# Patient Record
Sex: Female | Born: 1960 | Race: White | Hispanic: No | Marital: Married | Smoking: Never smoker
Health system: Southern US, Community
[De-identification: ages and names within clinical notes are randomized; demographics above are authoritative.]

## PROBLEM LIST (undated history)

## (undated) DIAGNOSIS — M5030 Other cervical disc degeneration, unspecified cervical region: Secondary | ICD-10-CM

## (undated) DIAGNOSIS — E039 Hypothyroidism, unspecified: Secondary | ICD-10-CM

## (undated) DIAGNOSIS — D1803 Hemangioma of intra-abdominal structures: Secondary | ICD-10-CM

## (undated) DIAGNOSIS — K5790 Diverticulosis of intestine, part unspecified, without perforation or abscess without bleeding: Secondary | ICD-10-CM

## (undated) DIAGNOSIS — G4710 Hypersomnia, unspecified: Secondary | ICD-10-CM

## (undated) DIAGNOSIS — M51369 Other intervertebral disc degeneration, lumbar region without mention of lumbar back pain or lower extremity pain: Secondary | ICD-10-CM

## (undated) DIAGNOSIS — M5136 Other intervertebral disc degeneration, lumbar region: Secondary | ICD-10-CM

## (undated) DIAGNOSIS — D259 Leiomyoma of uterus, unspecified: Secondary | ICD-10-CM

## (undated) DIAGNOSIS — D649 Anemia, unspecified: Secondary | ICD-10-CM

## (undated) HISTORY — DX: Hemangioma of intra-abdominal structures: D18.03

## (undated) HISTORY — DX: Anemia, unspecified: D64.9

## (undated) HISTORY — DX: Other cervical disc degeneration, unspecified cervical region: M50.30

## (undated) HISTORY — DX: Other intervertebral disc degeneration, lumbar region: M51.36

## (undated) HISTORY — PX: THYROIDECTOMY, PARTIAL: SHX18

## (undated) HISTORY — DX: Other intervertebral disc degeneration, lumbar region without mention of lumbar back pain or lower extremity pain: M51.369

## (undated) HISTORY — DX: Leiomyoma of uterus, unspecified: D25.9

## (undated) HISTORY — PX: BREAST BIOPSY: SHX20

## (undated) HISTORY — PX: TONSILLECTOMY AND ADENOIDECTOMY: SUR1326

## (undated) HISTORY — PX: OTHER SURGICAL HISTORY: SHX169

## (undated) HISTORY — DX: Diverticulosis of intestine, part unspecified, without perforation or abscess without bleeding: K57.90

## (undated) HISTORY — DX: Hypothyroidism, unspecified: E03.9

## (undated) HISTORY — DX: Hypersomnia, unspecified: G47.10

---

## 1998-08-21 ENCOUNTER — Ambulatory Visit (HOSPITAL_BASED_OUTPATIENT_CLINIC_OR_DEPARTMENT_OTHER): Admission: RE | Admit: 1998-08-21 | Discharge: 1998-08-21 | Payer: Self-pay

## 2000-12-20 ENCOUNTER — Encounter: Payer: Self-pay | Admitting: Family Medicine

## 2000-12-20 ENCOUNTER — Ambulatory Visit (HOSPITAL_COMMUNITY): Admission: RE | Admit: 2000-12-20 | Discharge: 2000-12-20 | Payer: Self-pay | Admitting: Family Medicine

## 2002-03-09 ENCOUNTER — Other Ambulatory Visit: Admission: RE | Admit: 2002-03-09 | Discharge: 2002-03-09 | Payer: Self-pay | Admitting: Obstetrics and Gynecology

## 2003-02-11 ENCOUNTER — Encounter (INDEPENDENT_AMBULATORY_CARE_PROVIDER_SITE_OTHER): Payer: Self-pay | Admitting: Specialist

## 2003-02-11 ENCOUNTER — Ambulatory Visit (HOSPITAL_COMMUNITY): Admission: RE | Admit: 2003-02-11 | Discharge: 2003-02-11 | Payer: Self-pay | Admitting: Obstetrics and Gynecology

## 2003-06-06 ENCOUNTER — Other Ambulatory Visit: Admission: RE | Admit: 2003-06-06 | Discharge: 2003-06-06 | Payer: Self-pay | Admitting: Obstetrics and Gynecology

## 2004-07-11 ENCOUNTER — Other Ambulatory Visit: Admission: RE | Admit: 2004-07-11 | Discharge: 2004-07-11 | Payer: Self-pay | Admitting: Obstetrics and Gynecology

## 2005-03-05 ENCOUNTER — Emergency Department (HOSPITAL_COMMUNITY): Admission: EM | Admit: 2005-03-05 | Discharge: 2005-03-05 | Payer: Self-pay | Admitting: Emergency Medicine

## 2005-07-16 ENCOUNTER — Other Ambulatory Visit: Admission: RE | Admit: 2005-07-16 | Discharge: 2005-07-16 | Payer: Self-pay | Admitting: Obstetrics and Gynecology

## 2006-08-22 ENCOUNTER — Other Ambulatory Visit: Admission: RE | Admit: 2006-08-22 | Discharge: 2006-08-22 | Payer: Self-pay | Admitting: Obstetrics & Gynecology

## 2007-08-15 ENCOUNTER — Encounter: Admission: RE | Admit: 2007-08-15 | Discharge: 2007-08-15 | Payer: Self-pay | Admitting: Obstetrics & Gynecology

## 2007-09-24 ENCOUNTER — Other Ambulatory Visit: Admission: RE | Admit: 2007-09-24 | Discharge: 2007-09-24 | Payer: Self-pay | Admitting: Obstetrics & Gynecology

## 2007-11-25 DIAGNOSIS — D1803 Hemangioma of intra-abdominal structures: Secondary | ICD-10-CM

## 2007-11-25 HISTORY — DX: Hemangioma of intra-abdominal structures: D18.03

## 2008-04-18 ENCOUNTER — Encounter: Admission: RE | Admit: 2008-04-18 | Discharge: 2008-04-18 | Payer: Self-pay | Admitting: Emergency Medicine

## 2008-04-26 ENCOUNTER — Encounter: Admission: RE | Admit: 2008-04-26 | Discharge: 2008-04-26 | Payer: Self-pay | Admitting: Emergency Medicine

## 2008-04-26 ENCOUNTER — Other Ambulatory Visit: Admission: RE | Admit: 2008-04-26 | Discharge: 2008-04-26 | Payer: Self-pay | Admitting: Interventional Radiology

## 2008-04-26 ENCOUNTER — Encounter (INDEPENDENT_AMBULATORY_CARE_PROVIDER_SITE_OTHER): Payer: Self-pay | Admitting: Interventional Radiology

## 2008-06-20 ENCOUNTER — Ambulatory Visit (HOSPITAL_COMMUNITY): Admission: RE | Admit: 2008-06-20 | Discharge: 2008-06-21 | Payer: Self-pay | Admitting: Surgery

## 2008-06-20 ENCOUNTER — Encounter (INDEPENDENT_AMBULATORY_CARE_PROVIDER_SITE_OTHER): Payer: Self-pay | Admitting: Surgery

## 2008-10-01 ENCOUNTER — Emergency Department: Payer: Self-pay | Admitting: Emergency Medicine

## 2010-01-17 ENCOUNTER — Encounter: Admission: RE | Admit: 2010-01-17 | Discharge: 2010-01-17 | Payer: Self-pay | Admitting: Internal Medicine

## 2010-10-16 ENCOUNTER — Other Ambulatory Visit: Payer: Self-pay | Admitting: Obstetrics & Gynecology

## 2010-10-16 DIAGNOSIS — Z1231 Encounter for screening mammogram for malignant neoplasm of breast: Secondary | ICD-10-CM

## 2010-10-25 ENCOUNTER — Ambulatory Visit
Admission: RE | Admit: 2010-10-25 | Discharge: 2010-10-25 | Disposition: A | Discharge status: Home / Self Care | Payer: BC Managed Care – PPO | Source: Ambulatory Visit | Attending: Obstetrics & Gynecology | Admitting: Obstetrics & Gynecology

## 2010-10-25 DIAGNOSIS — Z1231 Encounter for screening mammogram for malignant neoplasm of breast: Secondary | ICD-10-CM

## 2011-01-08 NOTE — Op Note (Signed)
[name], [name]                ACCOUNT NO.:  [id number]   MEDICAL RECORD NO.:  [id number]          PATIENT TYPE:  OIB   LOCATION:  0098                         FACILITY:  [facility]   PHYSICIAN:  Velora Heckler, MD      DATE OF BIRTH:  [birth year]   DATE OF PROCEDURE:  06/20/2008  DATE OF DISCHARGE:                               OPERATIVE REPORT   PREOPERATIVE DIAGNOSIS:  Left thyroid nodule with cytologic atypia.   POSTOPERATIVE DIAGNOSIS:  Left thyroid nodule with cytologic atypia.   PROCEDURE:  Left thyroid lobectomy.   SURGEON:  Velora Heckler, MD, FACS   ASSISTANT:  Emelia Loron, MD   ANESTHESIA:  General.   ESTIMATED BLOOD LOSS:  Minimal.   PREPARATION:  ChloraPrep.   ESTIMATED BLOOD LOSS:  Minimal.   COMPLICATIONS:  None.   INDICATIONS:  The patient is a 50 year old white female from [place],  [place].  She presents at the request of Dr. Leslee Home with a  newly diagnosed left thyroid nodule found on routine physical  examination in August 2009.  Ultrasound showed a 2 cm solid nodule in  the left thyroid lobe.  Fine-needle aspiration showed follicular  epithelial cells with nuclear grooves and nuclear overlap.  The patient  now comes to surgery for resection for definitive diagnosis.   BODY OF REPORT:  Procedure is done in OR #6 at the [facility].  The patient is brought to the operating room, placed in a  supine position on the operating room table.  Following administration  of general anesthesia, the patient is positioned and then prepped and  draped in the usual strict aseptic fashion.  After ascertaining that an  adequate level of anesthesia had been achieved, a Kocher incision is  made with a #15 blade.  Dissection was carried through subcutaneous  tissues and platysma.  Hemostasis is obtained with the electrocautery.  Skin flaps were elevated cephalad and caudad from the thyroid notch to  the sternal notch.  A Mahorner  self-retaining retractor was placed for  exposure.  Strap muscles were incised in the midline.  Dissection is  begun on the left side.  Strap muscles are reflected laterally.  The  left thyroid lobe is exposed.  Lobe is small, quite firm, consistent  with thyroiditis.  There is a dominant nodule located anteriorly and  centrally in the lobe.  Inferior venous tributaries are divided between  small and medium Ligaclips using the Harmonic scalpel.  Superior pole  was dissected out.  Superior pole vessels are divided between medium  Ligaclips with the Harmonic scalpel.  Parathyroid tissue was identified  and preserved.  There is a moderate-sized pyramidal lobe which is also  dissected out with electrocautery and included with the specimen.  Gland  is rolled anteriorly.  Branches of the inferior thyroid artery are  divided between small Ligaclips with the Harmonic scalpel.  Gland is  rolled further anteriorly and the recurrent laryngeal nerve is  identified and preserved.  Using sharp dissection, the ligament of Allyson Sabal  is transected.  The gland is then  mobilized onto the anterior trachea  using the electrocautery for hemostasis.  Isthmus is mobilized across  the midline.  The isthmus is transected at its junction with the right  thyroid lobe using the Harmonic scalpel.  Specimen is submitted to  pathology where Dr. Guerry Bruin reviewed the specimen.  He notes changes  consistent with chronic lymphocytic thyroiditis.  He saw no evidence of  malignancy.  Neck is irrigated with warm saline.  Good hemostasis was  achieved.  Surgicel is placed in the operative field.  Strap muscles are  reapproximated in the midline with interrupted #3-0 Vicryl sutures.  Platysma was closed with interrupted #3-0 Vicryl sutures.  Skin is  closed with a running #4-0 Monocryl subcuticular suture.  Wound is  washed and dried and Benzoin and Steri-Strips are applied.  Sterile  dressings are applied.  The patient is  awakened from anesthesia and  brought to the recovery room in stable condition.  The patient tolerated  the procedure well.      Velora Heckler, MD  Electronically Signed     TMG/MEDQ  D:  06/20/2008  T:  06/20/2008  Job:  [id number]   cc:   Reuben Likes, M.D.  Fax: [phone]

## 2011-01-11 NOTE — Op Note (Signed)
NAME:  [name], [name]                          ACCOUNT NO.:  [id number]   MEDICAL RECORD NO.:  [id number]                   PATIENT TYPE:  AMB   LOCATION:  SDC                                  FACILITY:  [facility]   PHYSICIAN:  Laqueta Linden, M.D.                 DATE OF BIRTH:  [birth year]   DATE OF PROCEDURE:  02/11/2003  DATE OF DISCHARGE:                                 OPERATIVE REPORT   PREOPERATIVE DIAGNOSES:  Abnormal uterine bleeding, intermenstrual bleeding,  with cervical and endometrial polyps.   POSTOPERATIVE DIAGNOSES:  1. Abnormal uterine bleeding, intermenstrual bleeding, with cervical and     endometrial polyps.  2. Submucosal fibroids.   PROCEDURE:  Hysteroscopic resection with excision of cervical polyp.   SURGEON:  Laqueta Linden, M.D.   ANESTHESIA:  General LMA.   ESTIMATED BLOOD LOSS:  100 mL.   SORBITOL NET INTAKE:  Less than 50 mL.   COMPLICATIONS:  None.   INDICATIONS:  [name] is a 50 year old gravida 2, para 2, female on  long term birth control pills with a new onset of abnormal intermenstrual  bleeding.  Ultrasound revealed intramural fibroids, and sonohysterogram  revealed two endometrial polyps with an irregular anterior wall as well.  She also had a large ectocervical polyp noted at the time of speculum exam.  It was felt that the polyps and possibly submucosal fibroids and the  cervical polyp were contributing to her abnormal bleeding.  She elected to  undergo hysteroscopic resection.  She has seen the informed consent film and  voiced her understanding of the risks, benefits, alternatives, and  complications, and agrees to proceed.  She understands that this may not be  a definitive procedure depending on the intraoperative findings and whether  the fibroids can be resected in their entirety.   PROCEDURE:  The patient was taken to the operating room and after proper  identification and consents were ascertained, she was placed on the  operating table in supine position.  After the induction of general LMA, she  was placed in the [place] stirrups and the perineum and vagina were prepped  and draped in the routine sterile fashion.  An anterior, normal-sized,  mobile uterus was noted.  A broad-based polyp was protruding off of the  cervix at 11 o'clock.  A transurethral Foley was placed, which was removed  at the conclusion of the procedure.  The internal os sounded to 9 cm and was  gently dilated to a #33 Pratt dilator.  However, there was some difficulty  in passing the resectoscope past the ectocervical polyp, requiring dilation  up to a 35 Pratt dilator.  The resectoscope was then inserted with  visualization of the endometrial cavity, revealing it to be diffusely  irregular with polypoid lesions on the posterior wall and what appeared to  be fundal and anterior submucosal fibroids.  The resectoscope was placed  on  the routine settings and the polyps and as much of the fibroids as felt safe  were resected.  Multiple pieces of tissue were removed.  Several small  bleeding points were cauterized.  At the conclusion of the procedure there  was clearly some fibroid left in the fundus, although it was felt that  further resection would risk transversing the entire uterine wall with  perforation, and therefore further resection was not undertaken.  The  polypoid lesions appeared to have been resected in their entirety.  There  was a slight amount of oozing noted but no active bleeding.  The  resectoscope was removed.  The polyp had actually been avulsed with the ring  forceps during the dilation process.  The base of the polyp was cauterized  and then a suture of 2-0 Vicryl was placed as well for additional  hemostasis.  The ectocervix was noted to be rather diffusely friable and was  oozing somewhat at the end of the procedure, but there was no active  bleeding.  There was no active bleeding from the external os, and the  polyp  site on the cervix was hemostatic.  All instruments were removed.  The total  estimated blood loss was approximately 100 mL, sorbitol net intake less than  50 mL.  Complications:  None.  The patient was stable on transfer to the  recovery room.  She will be observed and discharged per anesthesia protocol.  She received Toradol 30 mg IV and 30 mg IM prior to the conclusion of the  procedure.  She will take Advil or Aleve as needed for cramping and continue  on her birth control pills at home.  She is to call the office for excessive  pain, fever, bleeding, or other concerns.  She is to otherwise follow up in  the office as scheduled in two weeks.                                               Laqueta Linden, M.D.    LKS/MEDQ  D:  02/11/2003  T:  02/11/2003  Job:  [id number]

## 2011-04-08 ENCOUNTER — Ambulatory Visit
Admission: RE | Admit: 2011-04-08 | Discharge: 2011-04-08 | Disposition: A | Discharge status: Home / Self Care | Payer: BC Managed Care – PPO | Source: Ambulatory Visit | Attending: Internal Medicine | Admitting: Internal Medicine

## 2011-04-08 ENCOUNTER — Other Ambulatory Visit: Payer: Self-pay | Admitting: Internal Medicine

## 2011-04-08 DIAGNOSIS — M2550 Pain in unspecified joint: Secondary | ICD-10-CM

## 2011-05-28 LAB — URINE MICROSCOPIC-ADD ON

## 2011-05-28 LAB — URINALYSIS, ROUTINE W REFLEX MICROSCOPIC
Glucose, UA: NEGATIVE
Nitrite: NEGATIVE
Specific Gravity, Urine: 1.026
pH: 6.5

## 2011-05-28 LAB — COMPREHENSIVE METABOLIC PANEL
AST: 32
Albumin: 3.6
Chloride: 104
Creatinine, Ser: 0.67
GFR calc Af Amer: >60
Total Bilirubin: 0.8
Total Protein: 6.5

## 2011-05-28 LAB — CBC
MCV: 94.3
Platelets: 277
RDW: 12.2
WBC: 4.2

## 2011-05-28 LAB — DIFFERENTIAL
Basophils Absolute: 0
Eosinophils Relative: 4
Lymphocytes Relative: 32
Lymphs Abs: 1.3
Monocytes Absolute: 0.4
Monocytes Relative: 10

## 2011-05-28 LAB — PREGNANCY, URINE: Preg Test, Ur: NEGATIVE

## 2013-05-25 ENCOUNTER — Other Ambulatory Visit: Payer: Self-pay | Admitting: Obstetrics & Gynecology

## 2013-05-25 NOTE — Telephone Encounter (Signed)
lmtcb

## 2013-05-25 NOTE — Telephone Encounter (Signed)
Pt needs aex. Last aex was 7/13

## 2013-05-27 NOTE — Telephone Encounter (Signed)
I have attempted to contact this patient by phone with the following results: left message to return my call on answering machine (home).  

## 2013-05-28 NOTE — Telephone Encounter (Signed)
eScribe request for refill on SYNTHROID (2 DOSES) Last filled - 04/15/12 X 1 YEAR Last AEX - 03/17/12 Next AEX - not scheduled Last TSH 11/26/10  Clinical staff has attempted to call patient twice.  Pt has not returned call.  Please advise refills.  Paper chart on your desk.

## 2013-05-28 NOTE — Telephone Encounter (Signed)
Please send a letter to the patient informing her that we will refill her synthroid for 2 more weeks and that we have tried unsuccessfully to contact her by phone.  She will need to call for an appointment.

## 2013-06-01 ENCOUNTER — Telehealth: Payer: Self-pay | Admitting: Obstetrics & Gynecology

## 2013-06-01 NOTE — Telephone Encounter (Signed)
Patient is returning a call but doesn't remember who it was that called. It was about medication.

## 2013-06-01 NOTE — Telephone Encounter (Addendum)
Message left to return call to [place] at [phone].   Unsure reason for call?  Needs OV with Dr. Edward Jolly before refills of Levothroid Last AEX was 02/2012, needs appointment.

## 2013-06-07 NOTE — Telephone Encounter (Signed)
Female answered phone. Left message to call back to Dr. Rica Records office.

## 2013-06-21 ENCOUNTER — Telehealth: Payer: Self-pay | Admitting: Obstetrics & Gynecology

## 2013-06-21 NOTE — Telephone Encounter (Signed)
Called home: Message left to return call to [place] at [phone].   Called Work: Set designer.

## 2013-06-21 NOTE — Telephone Encounter (Signed)
Patient calling re: post menopausal spotting recently and bleeding this morning. Please advise?

## 2013-06-22 NOTE — Telephone Encounter (Signed)
Called patient.  Female answered phone. He states that the patient tried to call back and phones were off even though office was open until 5 pm, tone was escalating.  I advised that the phones are off at 4:30 and I attempted to return call and no one answered.  He states that she is not home and she is at work and cannot be reached at work. I asked if he could give a message that I attempted to return call again. He states that he would try but that she wouldn't be available until after 4:30 again and I offered to call home after 4:30 and he said that he would have her call.   Routing to Dr. Hyacinth Meeker for fyi, I have attempted to contact patient and unable to contact to schedule her for OV.

## 2013-06-22 NOTE — Telephone Encounter (Signed)
Patient returned call.  She states that she has been having bleeding intermittently. Spotting in June and August which resolved. On Sunday 10/26, she noted fresh red blood on tissue after having a BM. Yesterday, she had a BM, then urinated and noted fresh blood on tissue. No blood in toilet and no active bleeding. She is not having to wear a liner.  She has a hx of hemorrhoids which she is not on any medication for.  She requested Wed appt-Dr. Hyacinth Meeker is not in office, she is okay with Thursday appointment, but would need appointment for after 4. 4:30 appointment scheduled but advised would need to consult with Dr. Hyacinth Meeker first to ensure this will work for appointment.  Spoke with Dr. Cyndia Diver for add on appointment.    Routing to provider for final review. Patient agreeable to disposition. Will close encounter

## 2013-06-22 NOTE — Telephone Encounter (Signed)
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

## 2013-06-24 ENCOUNTER — Ambulatory Visit (INDEPENDENT_AMBULATORY_CARE_PROVIDER_SITE_OTHER): Payer: BC Managed Care – PPO

## 2013-06-24 ENCOUNTER — Encounter: Payer: Self-pay | Admitting: Obstetrics & Gynecology

## 2013-06-24 ENCOUNTER — Ambulatory Visit (INDEPENDENT_AMBULATORY_CARE_PROVIDER_SITE_OTHER): Payer: BC Managed Care – PPO | Admitting: Obstetrics & Gynecology

## 2013-06-24 VITALS — BP 120/64 | Ht 67.0 in | Wt 162.0 lb

## 2013-06-24 DIAGNOSIS — R319 Hematuria, unspecified: Secondary | ICD-10-CM

## 2013-06-24 DIAGNOSIS — N950 Postmenopausal bleeding: Secondary | ICD-10-CM

## 2013-06-24 NOTE — Progress Notes (Signed)
52 y.o.Marriedfemale with h/o questionable PMP bleeding.  Pt reports 10/26 had bright red bleeding that occurred with bowel movement.  It was clearly in the toilet.  Bleeding did not continue after bowel movement.  BM was not hard.  She has no straining.  Then the next day, she saw bleeding again when wiping from front to back.  She really was not sure if it was vaginal.  Stopped on own.  No urinary symptoms.  No constipation.  Did not have colonoscopy last year due to cost.  No dizziness.  No LMP recorded.           ROS:  Negative except as per HPI  General appearance: alert, cooperative and appears stated age Abdomen: soft, non-tender; bowel sounds normal; no masses,  no organomegaly  Pelvic: External genitalia:  no lesions              Urethra:  normal appearing urethra with no masses, tenderness or lesions              Bartholins and Skenes: normal                 Vagina: normal appearing vagina with normal color and discharge, no lesions              Cervix: no lesions and no vaginal bleeding              Pap taken: no Bimanual Exam:  Uterus:  normal size, contour, position, consistency, mobility, non-tender              Adnexa: normal adnexa and no mass, fullness, tenderness               Rectovaginal: Confirms, anterior hemorrhoid present               Anus:  normal sphincter tone, no lesions  FINDINGS: UTERUS: 4.0 x 3.9 x 3.6cm EMS: 1.72mm ADNEXA:   Left ovary 1.9 x 0.9 x 0.7cm, atrophic   Right ovary 1.8 x 0.9 x 1.1cm, atrohpic CUL DE SAC: no free fluid   Endometrial biopsy NOT recommended due to this endometrium and atrophic appearance of vagina.     Cath u/a was obtained.  Assessment:  Bleeding that is most likely rectal, no PMP Plan: Cath u/a pending.  If negative, pt needs to proceed with colonoscopy.  Would have her consider going to [facility] for this due to cost concerns.  ~25 minutes spent with patient >50% of time was in face to face discussion of  above.

## 2013-06-25 ENCOUNTER — Encounter: Payer: Self-pay | Admitting: Obstetrics & Gynecology

## 2013-06-25 LAB — URINALYSIS, MICROSCOPIC ONLY: Bacteria, UA: NONE SEEN

## 2013-06-25 NOTE — Patient Instructions (Signed)
We will call with u/a results when back.

## 2013-06-30 ENCOUNTER — Telehealth: Payer: Self-pay | Admitting: *Deleted

## 2013-06-30 NOTE — Telephone Encounter (Signed)
Call to patient to review results, LMTCB. 

## 2013-06-30 NOTE — Telephone Encounter (Signed)
Message copied by Alisa Graff on Wed Jun 30, 2013  4:41 PM ------      Message from: Jerene Bears      Created: Wed Jun 30, 2013  6:32 AM       Inform micro negative.  Pt came with possible vaginal bleeding.  Endometrium very thin on U/S.  No biopsy done.  She does have hemorrhoids.  U/A was to make sure no blood present--there's not.  She needs colonoscopy.  She cancelled with Dr. Loreta Ave last year due to cost.  [facility] would be just copay.  I called and this was the information I was given.  She doesn't need a referral. ------

## 2013-07-08 NOTE — Telephone Encounter (Signed)
Message copied by Alisa Graff on Thu Jul 08, 2013  9:59 AM ------      Message from: Jerene Bears      Created: Wed Jun 30, 2013  6:32 AM       Inform micro negative.  Pt came with possible vaginal bleeding.  Endometrium very thin on U/S.  No biopsy done.  She does have hemorrhoids.  U/A was to make sure no blood present--there's not.  She needs colonoscopy.  She cancelled with Dr. Loreta Ave last year due to cost.  [facility] in [facility] would be just copay.  I called and this was the information I was given.  She doesn't need a referral. ------

## 2013-07-08 NOTE — Telephone Encounter (Signed)
2nd call to patient to review results. LMTCB.

## 2013-07-09 ENCOUNTER — Other Ambulatory Visit: Payer: Self-pay | Admitting: Internal Medicine

## 2013-07-09 NOTE — Telephone Encounter (Signed)
Patient retrurned call. Notifiedof urine micro as directed by Dr Hyacinth Meeker and colonoscopy recommended. Advised Dr Hyacinth Meeker called [facility] personally and this should be a cost effective option for her. She is agreeable and asked her to have them send her results to Dr Hyacinth Meeker. Patient agreeable.

## 2013-07-14 ENCOUNTER — Ambulatory Visit
Admission: RE | Admit: 2013-07-14 | Discharge: 2013-07-14 | Disposition: A | Discharge status: Home / Self Care | Payer: BC Managed Care – PPO | Source: Ambulatory Visit | Attending: Internal Medicine | Admitting: Internal Medicine

## 2013-08-18 ENCOUNTER — Telehealth: Payer: Self-pay | Admitting: Obstetrics & Gynecology

## 2013-08-18 NOTE — Telephone Encounter (Signed)
Patient needs refill of  levothyroxine (SYNTHROID, LEVOTHROID) 88 MCG tablet  TAKE 1 TABLET EVERY OTHER DAY, Normal, Last Dose: Not Recorded  Refills: 0 ordered Pharmacy: [facility] - [place], [place] - [address]   Wants a weeks worth sent to [facility] # is [phone]. To get [facility].

## 2013-08-18 NOTE — Telephone Encounter (Signed)
Rx for 30 days called to [facility] per pt's request.

## 2013-09-17 ENCOUNTER — Telehealth: Payer: Self-pay

## 2014-02-10 NOTE — Telephone Encounter (Signed)
Encounter opened in error. Closing encounter.

## 2014-03-14 ENCOUNTER — Telehealth: Payer: Self-pay | Admitting: Obstetrics & Gynecology

## 2014-03-14 NOTE — Telephone Encounter (Signed)
Patient stated she is returning [facility] request by letter to contact our office with a new cell phone number and questions about a colonoscopy.  1. Colonoscopy not done yet. No appointments set up yet but patient plans on scheduling soon.  2. Cell phone number updated in demographics.

## 2014-03-16 NOTE — Telephone Encounter (Signed)
Dr. Sabra Heck,  Please see Starla's note below.   Claiborne Billings has been unable to contact patient and sent her a letter.  Please advise any further follow up.

## 2014-03-16 NOTE — Telephone Encounter (Signed)
Message copied by Graylon Good on Wed Mar 16, 2014 11:09 AM ------      Message from: Megan Salon      Created: Tue Sep 28, 2013  5:34 AM      Regarding: FW: colonoscopy report       Can you follow up with pt and see if she had colonoscopy done.  Came for rectal bleeding and I recommended she go for colonoscopy.  She was considering [facility] due to cost.            MSM      ----- Message -----         From: Lyman Speller, MD         Sent: 06/30/2013   6:32 AM           To: Lyman Speller, MD      Subject: colonoscopy report                                              ------

## 2014-03-21 NOTE — Telephone Encounter (Signed)
Thank you.  Letter sent by K. Lucillie Garfinkel.  Encounter closed.

## 2014-03-25 ENCOUNTER — Other Ambulatory Visit: Payer: Self-pay | Admitting: Internal Medicine

## 2014-03-25 DIAGNOSIS — Z1231 Encounter for screening mammogram for malignant neoplasm of breast: Secondary | ICD-10-CM

## 2014-04-06 ENCOUNTER — Encounter (INDEPENDENT_AMBULATORY_CARE_PROVIDER_SITE_OTHER): Payer: Self-pay

## 2014-04-06 ENCOUNTER — Ambulatory Visit
Admission: RE | Admit: 2014-04-06 | Discharge: 2014-04-06 | Disposition: A | Discharge status: Home / Self Care | Payer: BC Managed Care – PPO | Source: Ambulatory Visit | Attending: Internal Medicine | Admitting: Internal Medicine

## 2014-04-06 DIAGNOSIS — Z1231 Encounter for screening mammogram for malignant neoplasm of breast: Secondary | ICD-10-CM

## 2014-06-27 ENCOUNTER — Encounter: Payer: Self-pay | Admitting: Obstetrics & Gynecology

## 2015-05-15 ENCOUNTER — Telehealth: Payer: Self-pay

## 2015-05-15 NOTE — Telephone Encounter (Signed)
Called pt to reschedule. Pt's husband answered the phone. I asked pt's husband to have the pt call us back.

## 2015-05-16 ENCOUNTER — Institutional Professional Consult (permissible substitution): Payer: BC Managed Care – PPO | Admitting: Neurology

## 2015-06-08 ENCOUNTER — Institutional Professional Consult (permissible substitution): Payer: BC Managed Care – PPO | Admitting: Neurology

## 2015-11-12 ENCOUNTER — Emergency Department (HOSPITAL_COMMUNITY): Payer: BC Managed Care – PPO

## 2015-11-12 ENCOUNTER — Emergency Department (HOSPITAL_COMMUNITY)
Admission: EM | Admit: 2015-11-12 | Discharge: 2015-11-12 | Disposition: A | Discharge status: Home / Self Care | Payer: BC Managed Care – PPO | Attending: Emergency Medicine | Admitting: Emergency Medicine

## 2015-11-12 ENCOUNTER — Encounter (HOSPITAL_COMMUNITY): Payer: Self-pay | Admitting: Emergency Medicine

## 2015-11-12 DIAGNOSIS — E039 Hypothyroidism, unspecified: Secondary | ICD-10-CM | POA: Diagnosis not present

## 2015-11-12 DIAGNOSIS — Z86018 Personal history of other benign neoplasm: Secondary | ICD-10-CM | POA: Diagnosis not present

## 2015-11-12 DIAGNOSIS — Z862 Personal history of diseases of the blood and blood-forming organs and certain disorders involving the immune mechanism: Secondary | ICD-10-CM | POA: Insufficient documentation

## 2015-11-12 DIAGNOSIS — R079 Chest pain, unspecified: Secondary | ICD-10-CM | POA: Diagnosis not present

## 2015-11-12 DIAGNOSIS — M546 Pain in thoracic spine: Secondary | ICD-10-CM | POA: Insufficient documentation

## 2015-11-12 DIAGNOSIS — R0789 Other chest pain: Secondary | ICD-10-CM

## 2015-11-12 DIAGNOSIS — Z79899 Other long term (current) drug therapy: Secondary | ICD-10-CM | POA: Diagnosis not present

## 2015-11-12 LAB — BASIC METABOLIC PANEL
ANION GAP: 11 (ref 5–15)
BUN: 14 mg/dL (ref 6–20)
CALCIUM: 9.1 mg/dL (ref 8.9–10.3)
CO2: 25 mmol/L (ref 22–32)
Chloride: 108 mmol/L (ref 101–111)
Creatinine, Ser: 0.7 mg/dL (ref 0.44–1.00)
GFR calc non Af Amer: >60 mL/min (ref >60)
Glucose, Bld: 116 mg/dL — ABNORMAL HIGH (ref 65–99)
Potassium: 4 mmol/L (ref 3.5–5.1)
SODIUM: 144 mmol/L (ref 135–145)

## 2015-11-12 LAB — I-STAT TROPONIN, ED
TROPONIN I, POC: 0 ng/mL (ref 0.00–0.08)
Troponin i, poc: 0 ng/mL (ref 0.00–0.08)

## 2015-11-12 LAB — CBC
HCT: 41.6 % (ref 36.0–46.0)
HEMOGLOBIN: 13.6 g/dL (ref 12.0–15.0)
MCH: 30.6 pg (ref 26.0–34.0)
MCHC: 32.7 g/dL (ref 30.0–36.0)
MCV: 93.7 fL (ref 78.0–100.0)
PLATELETS: 268 10*3/uL (ref 150–400)
RBC: 4.44 MIL/uL (ref 3.87–5.11)
RDW: 12.7 % (ref 11.5–15.5)
WBC: 5 10*3/uL (ref 4.0–10.5)

## 2015-11-12 LAB — D-DIMER, QUANTITATIVE (NOT AT ARMC): D-Dimer, Quant: <0.27 ug/mL-FEU (ref 0.00–0.50)

## 2015-11-12 MED ORDER — IBUPROFEN 800 MG PO TABS: 800.0000 mg | ORAL_TABLET | Freq: Three times a day (TID) | ORAL | Status: DC | PRN

## 2015-11-12 MED ORDER — HYDROCODONE-ACETAMINOPHEN 5-325 MG PO TABS
1.0000 | ORAL_TABLET | Freq: Once | ORAL | Status: DC
  Filled 2015-11-12: qty 1

## 2015-11-12 MED ORDER — HYDROCODONE-ACETAMINOPHEN 5-325 MG PO TABS: 1.0000 | ORAL_TABLET | Freq: Four times a day (QID) | ORAL | Status: DC | PRN

## 2015-11-12 NOTE — ED Notes (Signed)
Pt abulates to BR with steady gait.

## 2015-11-12 NOTE — ED Notes (Signed)
Pt states she was pain free after ambulating to BR but now c/o pain

## 2015-11-12 NOTE — ED Provider Notes (Signed)
CSN: [id number]     Arrival date & time 11/12/15  1143 History   First MD Initiated Contact with Patient 11/12/15 1602     Chief Complaint  Patient presents with  . Chest Pain  . Shoulder Pain     (Consider location/radiation/quality/duration/timing/severity/associated sxs/prior Treatment) HPI Patient presents to the emergency department with chest tightness on the left side and pain in the left scapula.  The patient states left scapular pain is more significant.  She states that it will last approximately an hour at a time, but does seem to go away intermittently.  She states nothing seems make the pain better or worse.  Patient states she has not had any exertional symptoms.  She denies any alleviating factors.  The patient states that she did not take any medications prior to arrival for her symptoms. The patient denies shortness of breath, headache,blurred vision, neck pain, fever, cough, weakness, numbness, dizziness, anorexia, edema, abdominal pain, nausea, vomiting, diarrhea, rash, dysuria, hematemesis, bloody stool, near syncope, diaphoresis, or syncope. Past Medical History  Diagnosis Date  . Uterine fibroid   . Liver hemangioma 4/09  . Hypothyroidism   . Anemia   . Diverticulosis   . Hypothyroidism   . DDD (degenerative disc disease), cervical   . DDD (degenerative disc disease), lumbar    Past Surgical History  Procedure Laterality Date  . Tonsillectomy and adenoidectomy    . Breast biopsy      benign  . Thyroidectomy, partial    . Fibroid tumor     Family History  Problem Relation Age of Onset  . Diabetes Maternal Grandfather   . Diabetes Paternal Grandmother   . Hypertension Paternal Grandmother   . Heart attack Paternal Grandmother   . Asthma Paternal Grandmother   . CVA Paternal Grandmother   . Leukemia Son   . Heart attack Maternal Grandmother   . Heart Problems Father     arrhythmia  . Rheum arthritis Father   . Hypertension Father   . CAD Father   .  Gout Father   . Sleep apnea Father   . Dementia Father   . Asthma Mother   . Osteoporosis Mother   . Hyperthyroidism Sister    Social History  Substance Use Topics  . Smoking status: Never Smoker   . Smokeless tobacco: Never Used  . Alcohol Use: No   OB History    Gravida Para Term Preterm AB TAB SAB Ectopic Multiple Living   2 2        2      Review of Systems  All other systems negative except as documented in the HPI. All pertinent positives and negatives as reviewed in the HPI.   Allergies  Sulfa antibiotics  Home Medications   Prior to Admission medications   Medication Sig Start Date End Date Taking? Authorizing Provider  aspirin EC 81 MG tablet Take 81 mg by mouth every 6 (six) hours as needed for moderate pain.   Yes Historical Provider, MD  levothyroxine (SYNTHROID, LEVOTHROID) 100 MCG tablet TAKE 1 TABLET EVERY OTHER DAY Patient taking differently: TAKE 1 TABLET EVERY DAY 05/25/13  Yes Brook E Yisroel Ramming, MD  meloxicam (MOBIC) 15 MG tablet Take 15 mg by mouth daily as needed for pain.   Yes Historical Provider, MD  Multiple Vitamin (MULTIVITAMIN WITH MINERALS) TABS tablet Take 1 tablet by mouth daily.   Yes Historical Provider, MD  omeprazole (PRILOSEC) 20 MG capsule Take 20 mg by mouth daily as needed (  for heartburn).   Yes Historical Provider, MD  levothyroxine (SYNTHROID, LEVOTHROID) 88 MCG tablet TAKE 1 TABLET EVERY OTHER DAY 05/25/13   Nunzio Cobbs, MD   BP 121/78 mmHg  Pulse 62  Temp(Src) 98.6 F (37 C) (Oral)  Resp 22  Ht 5' 7.5" (1.715 m)  Wt 88.083 kg  BMI 29.95 kg/m2  SpO2 100% Physical Exam  Constitutional: She is oriented to person, place, and time. She appears well-developed and well-nourished. No distress.  HENT:  Head: Normocephalic and atraumatic.  Mouth/Throat: Oropharynx is clear and moist.  Eyes: Pupils are equal, round, and reactive to light.  Neck: Normal range of motion. Neck supple.  Cardiovascular: Normal rate,  regular rhythm and normal heart sounds.  Exam reveals no gallop and no friction rub.   No murmur heard. Pulmonary/Chest: Effort normal and breath sounds normal. No respiratory distress. She has no wheezes.  Abdominal: Soft. Bowel sounds are normal. She exhibits no distension. There is no tenderness.  Neurological: She is alert and oriented to person, place, and time. She exhibits normal muscle tone. Coordination normal.  Skin: Skin is warm and dry. No rash noted. No erythema.  Psychiatric: She has a normal mood and affect. Her behavior is normal.  Nursing note and vitals reviewed.   ED Course  Procedures (including critical care time) Labs Review Labs Reviewed  BASIC METABOLIC PANEL - Abnormal; Notable for the following:    Glucose, Bld 116 (*)    All other components within normal limits  CBC  D-DIMER, QUANTITATIVE (NOT AT [facility])  Randolm Idol, ED  Randolm Idol, ED    Imaging Review Dg Chest 2 View  11/12/2015  CLINICAL DATA:  Left scapular pain and chest pressure sensations since yesterday. EXAM: CHEST  2 VIEW COMPARISON:  06/17/2008. FINDINGS: Normal sized heart. Clear lungs with normal vascularity. Mild biapical pleural and parenchymal scarring. Minimal thoracic spine degenerative changes. IMPRESSION: No acute abnormality. Electronically Signed   By: Claudie Revering M.D.   On: 11/12/2015 14:17   I have personally reviewed and evaluated these images and lab results as part of my medical decision-making.   EKG Interpretation   Date/Time:  Sunday November 12 2015 11:48:22 EDT Ventricular Rate:  76 PR Interval:  136 QRS Duration: 78 QT Interval:  370 QTC Calculation: 416 R Axis:   74 Text Interpretation:  Normal sinus rhythm Normal ECG No old tracing to  compare Confirmed by KNAPP  MD-J, JON [id number]) on 11/12/2015 7:54:58 PM      Reviewed all labs and x-rays.  The patient will be discharged home.  She is low risk based on well's criteria and is PERC negative.  The patient  has a negative d-dimer as well.  She has 2 sets of negative troponins.  This seems atypical for cardiac chest pain, but we will have her follow-up with her primary care doctor for further evaluation and recheck.  Dalia Heading, PA-C 11/12/15 1955  Dorie Rank, MD 11/12/15 [phone]

## 2015-11-12 NOTE — Discharge Instructions (Signed)
Return here as needed.  Follow-up with your primary care doctor °

## 2015-11-12 NOTE — ED Notes (Addendum)
Pt c/o generalized chest pain and left shoulder pain onset last night. Pt was able to take 1 asa and the pain resolved temporary. Pain came back again this morning pt took another asa with relief. Pain returned shortly after. Pt also was given 1 nitro.

## 2015-11-12 NOTE — ED Notes (Signed)
Recollected Istat Trop I lab.

## 2015-11-12 NOTE — ED Notes (Signed)
Pt departed in NAD.  

## 2017-12-25 ENCOUNTER — Telehealth: Payer: Self-pay | Admitting: Internal Medicine

## 2017-12-25 NOTE — Telephone Encounter (Signed)
Called patient and left message asked her to call back and schd new patient consult with DSK. Beth

## 2018-01-05 ENCOUNTER — Ambulatory Visit: Payer: Self-pay | Admitting: Internal Medicine

## 2018-02-05 ENCOUNTER — Ambulatory Visit: Payer: BC Managed Care – PPO | Admitting: Internal Medicine

## 2018-02-05 ENCOUNTER — Encounter: Payer: Self-pay | Admitting: Internal Medicine

## 2018-02-05 DIAGNOSIS — G4710 Hypersomnia, unspecified: Secondary | ICD-10-CM | POA: Diagnosis not present

## 2018-02-05 DIAGNOSIS — R0683 Snoring: Secondary | ICD-10-CM | POA: Insufficient documentation

## 2018-02-05 HISTORY — DX: Hypersomnia, unspecified: G47.10

## 2018-02-05 NOTE — Patient Instructions (Signed)

## 2018-02-05 NOTE — Progress Notes (Signed)
[facility] [place], [place] [address]  Pulmonary Sleep Medicine   Office Visit Note  Patient Name: [name] DOB: 1961-07-17 MRN [id number]  Date of Service: 02/05/2018  Complaints/HPI:  She is here as a new patient for evaluation of sleep apnea.  Patient states that she has excessive daytime somnolence along with this she has had increased snoring.  She states that she is waking up frequently at nighttime she feels that she wakes up at least 5 times to urinate.  She has had some headaches during the daytime.  She states that she is not able to sleep in the same room with her husband because of her snoring.  He also has sleep apnea.  She has never fallen asleep while driving she has fallen asleep when she sits and watches TV.  She feels tired later in the daytime also.  She has never been tested previously for sleep apnea.  ROS  General: (-) fever, (-) chills, (-) night sweats, (-) weakness Skin: (-) rashes, (-) itching,. Eyes: (-) visual changes, (-) redness, (-) itching. Nose and Sinuses: (-) nasal stuffiness or itchiness, (-) postnasal drip, (-) nosebleeds, (-) sinus trouble. Mouth and Throat: (-) sore throat, (-) hoarseness. Neck: (-) swollen glands, (-) enlarged thyroid, (-) neck pain. Respiratory: - cough, (-) bloody sputum, - shortness of breath, - wheezing. Cardiovascular: - ankle swelling, (-) chest pain. Lymphatic: (-) lymph node enlargement. Neurologic: (-) numbness, (-) tingling. Psychiatric: (-) anxiety, (-) depression   Current Medication: Outpatient Encounter Medications as of 02/05/2018  Medication Sig  . levothyroxine (SYNTHROID, LEVOTHROID) 100 MCG tablet TAKE 1 TABLET EVERY OTHER DAY (Patient taking differently: TAKE 1 TABLET EVERY DAY)  . meloxicam (MOBIC) 15 MG tablet Take 15 mg by mouth daily as needed for pain.  . Multiple Vitamin (MULTIVITAMIN WITH MINERALS) TABS tablet Take 1 tablet by mouth daily.  Marland Kitchen omeprazole (PRILOSEC) 20  MG capsule Take 20 mg by mouth daily as needed (for heartburn).  Marland Kitchen aspirin EC 81 MG tablet Take 81 mg by mouth every 6 (six) hours as needed for moderate pain.  Marland Kitchen HYDROcodone-acetaminophen (NORCO/VICODIN) 5-325 MG tablet Take 1 tablet by mouth every 6 (six) hours as needed for moderate pain. (Patient not taking: Reported on 02/05/2018)  . ibuprofen (ADVIL,MOTRIN) 800 MG tablet Take 1 tablet (800 mg total) by mouth every 8 (eight) hours as needed. (Patient not taking: Reported on 02/05/2018)  . levothyroxine (SYNTHROID, LEVOTHROID) 88 MCG tablet TAKE 1 TABLET EVERY OTHER DAY (Patient not taking: Reported on 02/05/2018)   No facility-administered encounter medications on file as of 02/05/2018.     Surgical History: Past Surgical History:  Procedure Laterality Date  . BREAST BIOPSY     benign  . fibroid tumor    . THYROIDECTOMY, PARTIAL    . TONSILLECTOMY AND ADENOIDECTOMY      Medical History: Past Medical History:  Diagnosis Date  . Anemia   . DDD (degenerative disc disease), cervical   . DDD (degenerative disc disease), lumbar   . Diverticulosis   . Hypothyroidism   . Hypothyroidism   . Liver hemangioma 4/09  . Uterine fibroid     Family History: Family History  Problem Relation Age of Onset  . Diabetes Maternal Grandfather   . Diabetes Paternal Grandmother   . Hypertension Paternal Grandmother   . Heart attack Paternal Grandmother   . Asthma Paternal Grandmother   . CVA Paternal Grandmother   . Leukemia Son   . Heart attack  Maternal Grandmother   . Heart Problems Father        arrhythmia  . Rheum arthritis Father   . Hypertension Father   . CAD Father   . Gout Father   . Sleep apnea Father   . Dementia Father   . Asthma Mother   . Osteoporosis Mother   . Hyperthyroidism Sister     Social History: Social History   Socioeconomic History  . Marital status: Married    Spouse name: Not on file  . Number of children: Not on file  . Years of education: Not on file   . Highest education level: Not on file  Occupational History  . Not on file  Social Needs  . Financial resource strain: Not on file  . Food insecurity:    Worry: Not on file    Inability: Not on file  . Transportation needs:    Medical: Not on file    Non-medical: Not on file  Tobacco Use  . Smoking status: Never Smoker  . Smokeless tobacco: Never Used  Substance and Sexual Activity  . Alcohol use: No  . Drug use: No  . Sexual activity: Not on file  Lifestyle  . Physical activity:    Days per week: Not on file    Minutes per session: Not on file  . Stress: Not on file  Relationships  . Social connections:    Talks on phone: Not on file    Gets together: Not on file    Attends religious service: Not on file    Active member of club or organization: Not on file    Attends meetings of clubs or organizations: Not on file    Relationship status: Not on file  . Intimate partner violence:    Fear of current or ex partner: Not on file    Emotionally abused: Not on file    Physically abused: Not on file    Forced sexual activity: Not on file  Other Topics Concern  . Not on file  Social History Narrative  . Not on file    Vital Signs: Blood pressure 128/89, pulse 70, resp. rate 16, height 5\' 7"  (1.702 m), weight 195 lb (88.5 kg), SpO2 98 %.  Examination: General Appearance: The patient is well-developed, well-nourished, and in no distress. Skin: Gross inspection of skin unremarkable. Head: normocephalic, no gross deformities. Eyes: no gross deformities noted. ENT: ears appear grossly normal no exudates. Neck: Supple. No thyromegaly. No LAD. Respiratory:  No rhonchi are noted at this time. Cardiovascular: Normal S1 and S2 without murmur or rub. Extremities: No cyanosis. pulses are equal. Neurologic: Alert and oriented. No involuntary movements.  LABS: No results found for this or any previous visit (from the past 2160 hour(s)).  Radiology: Dg Chest 2 View  Result  Date: 11/12/2015 CLINICAL DATA:  Left scapular pain and chest pressure sensations since yesterday. EXAM: CHEST  2 VIEW COMPARISON:  06/17/2008. FINDINGS: Normal sized heart. Clear lungs with normal vascularity. Mild biapical pleural and parenchymal scarring. Minimal thoracic spine degenerative changes. IMPRESSION: No acute abnormality. Electronically Signed   By: Claudie Revering M.D.   On: 11/12/2015 14:17    No results found.  No results found.    Assessment and Plan: Patient Active Problem List   Diagnosis Date Noted  . Hypersomnia 02/05/2018  . Morbid obesity ([place]) 02/05/2018  . Snoring 02/05/2018    1. Hypersmonia  She has signs and symptoms consistent with obstructive sleep apnea.  Her Mallampati  score is 3 I think she will benefit from getting a sleep study done will get this scheduled for her. 2. Morbid obesity  Discussed diet next senna she does need to work on weight loss.  She states that she has gained a lot of weight recently 3. Snoring if she is negative for OSA then we will have her evaluated by ENT for an upper airway evaluation   General Counseling: I have discussed the findings of the evaluation and examination with Julann.  I have also discussed any further diagnostic evaluation thatmay be needed or ordered today. Makela verbalizes understanding of the findings of todays visit. We also reviewed her medications today and discussed drug interactions and side effects including but not limited excessive drowsiness and altered mental states. We also discussed that there is always a risk not just to her but also people around her. she has been encouraged to call the office with any questions or concerns that should arise related to todays visit.    Time spent: 28min  I have personally obtained a history, examined the patient, evaluated laboratory and imaging results, formulated the assessment and plan and placed orders.    Allyne Gee, MD [facility] Pulmonary and Critical  Care Sleep medicine

## 2018-03-02 ENCOUNTER — Other Ambulatory Visit (INDEPENDENT_AMBULATORY_CARE_PROVIDER_SITE_OTHER): Payer: BC Managed Care – PPO | Admitting: Internal Medicine

## 2018-03-02 DIAGNOSIS — G4710 Hypersomnia, unspecified: Secondary | ICD-10-CM

## 2018-03-09 NOTE — Procedures (Signed)
[facility] [place], [place] [address]  Sleep Specialist: Allyne Gee, MD [place] Sleep Study Interpretation  Patient Name: [name] Patient MR [id number] DOB:Apr 13, 1961  Date of Study:  March 02, 2018  Indications for study:  Obstructive sleep apnea  BMI:  30.5 kilogram/meter squared       Respiratory Data:  Total AHI:  9.4 per hour  Total Obstructive Apneas:  4  Total Central Apneas:  0  Total Mixed Apneas:  0  Total Hypopneas:  37  If the AHI is greater than 5 per hour patient qualifies for PAP evaluation  Oximetry Data:  Oxygen Desaturation Index: 10.4  Lowest Desaturation:  84%  Cardiac Data:  Minimum Heart Rate:  51  Maximum Heart Rate:  68   Impression / Diagnosis:   this apnea link study demonstrates presence of mild sleep disordered breathing with obstructive sleep apnea.  There is significant oxygen desaturation down to 84%.  The lowest saturation noted was 80%.  Time spent below 88% was 22 min.  Patient would benefit from evaluation with CPAP titration study.  GENERAL Recommendations:  1.  Consider Auto PAP with pressure ranges 5-20 cmH20 with download, or facility based PAP Titration Study  2.  Consider PAP interface mask fitted for patient comfort, Heated Humidification & PAP compliance monitoring (1 month, 3 months & 12 months after PAP initiation)  3. Consider treatment with mandibular advancement splint (MAS) or referral to an ENT surgeon for modification to the upper airway if the patient prefers an alternate therapy or the PAP trial is unsuccessful  4. Sleep hygiene measures should be discussed with the patient  5. Behavioral therapy such as weight reduction or smoking cessation as appropriate for the patient  6. Advise patient against the use of alcohol or sedatives in so much as these substances can worsen excessive daytime sleepiness and respiratory disturbances of sleep  7. Advise patient  against participating in potentially dangerous activities while drowsy such as operating a motor vehicle, heavy equipment or power tools as it can put them and others in danger  8. Advise patient of the long term consequences of OSA if left untreated, need for treatment and close follow up  9. Clinical follow up as deemed necessary     This Level III home sleep study was performed using the [employer], a 4 channel screening device subject to limitations. Depending on actual total sleep time, not measured in this study, the AHI (sum of apneas and hypopneas/hr of sleep) and therefore the severity of sleep apnea may be underestimated. As with any single night study, including Level 1 attended PSG, severity of sleep apnea may also be underestimated due to the lack of supine and/or REM sleep.  The interpretation associated with this report is based on normal values and degrees of severity in accordance with AASM parameters and/or estimated from multiple sources in the literature for adults ages 46-80+. These may not agree with the displayed values. The patient's treating physician should use the interpretation and recommendations in conjunction with the overall clinical evaluation and treatment of the patient.  Some of the terminology used in this scored [facility]nea[facility] report was developed several years ago and may not always be in accordance with current nomenclature. This in no way affects the accuracy of the data or the reliability of the interpretation and recommendations.

## 2018-03-19 ENCOUNTER — Ambulatory Visit: Payer: Self-pay | Admitting: Internal Medicine

## 2018-08-24 ENCOUNTER — Encounter: Payer: Self-pay | Admitting: Pulmonary Disease

## 2018-08-24 ENCOUNTER — Ambulatory Visit: Payer: BC Managed Care – PPO | Admitting: Pulmonary Disease

## 2018-08-24 VITALS — BP 132/88 | HR 76 | Ht 67.0 in | Wt 200.0 lb

## 2018-08-24 DIAGNOSIS — G4733 Obstructive sleep apnea (adult) (pediatric): Secondary | ICD-10-CM | POA: Diagnosis not present

## 2018-08-24 DIAGNOSIS — Z87898 Personal history of other specified conditions: Secondary | ICD-10-CM

## 2018-08-24 NOTE — Progress Notes (Signed)
[name]    [id number]    August 26, 1961  Primary Care Physician:Avva, Ravisankar, MD  Referring Physician: Prince Solian, [place], [place] [address]  Chief complaint:   Patient with a history of snoring Had a home sleep study performed revealing mild obstructive sleep apnea  HPI:  She stated she is occasionally sleepy -about after lunch Wakes up feeling like she is had a decent night rest, on occasion she feels tired Usually goes to bed about 9 PM, falls asleep easily Wakes up about 5:15 in the morning Wakes up up to 4 times a night to use the bathroom Occasional memory issues No problems focusing on issues Family history of obstructive sleep apnea in a dad  History of hypothyroidism No heart disease No history of depression/anxiety or other mood disorder  Never smoker No history of significant exposure:  Outpatient Encounter Medications as of 08/24/2018  Medication Sig  . aspirin EC 81 MG tablet Take 81 mg by mouth every 6 (six) hours as needed for moderate pain.  Marland Kitchen ibuprofen (ADVIL,MOTRIN) 800 MG tablet Take 1 tablet (800 mg total) by mouth every 8 (eight) hours as needed.  Marland Kitchen levothyroxine (SYNTHROID, LEVOTHROID) 88 MCG tablet TAKE 1 TABLET EVERY OTHER DAY  . meloxicam (MOBIC) 15 MG tablet Take 15 mg by mouth daily as needed for pain.  Marland Kitchen omeprazole (PRILOSEC) 20 MG capsule Take 20 mg by mouth daily as needed (for heartburn).  . rosuvastatin (CRESTOR) 5 MG tablet Take 5 mg by mouth daily.  . [DISCONTINUED] HYDROcodone-acetaminophen (NORCO/VICODIN) 5-325 MG tablet Take 1 tablet by mouth every 6 (six) hours as needed for moderate pain.  . [DISCONTINUED] levothyroxine (SYNTHROID, LEVOTHROID) 100 MCG tablet TAKE 1 TABLET EVERY OTHER DAY (Patient taking differently: TAKE 1 TABLET EVERY DAY)  . [DISCONTINUED] Multiple Vitamin (MULTIVITAMIN WITH MINERALS) TABS tablet Take 1 tablet by mouth daily.   No facility-administered encounter medications  on file as of 08/24/2018.     Allergies as of 08/24/2018 - Review Complete 08/24/2018  Allergen Reaction Noted  . Sulfa antibiotics Itching and Rash 06/24/2013    Past Medical History:  Diagnosis Date  . Anemia   . DDD (degenerative disc disease), cervical   . DDD (degenerative disc disease), lumbar   . Diverticulosis   . Hypersomnia 02/05/2018  . Hypothyroidism   . Hypothyroidism   . Liver hemangioma 4/09  . Uterine fibroid     Past Surgical History:  Procedure Laterality Date  . BREAST BIOPSY     benign  . fibroid tumor    . THYROIDECTOMY, PARTIAL    . TONSILLECTOMY AND ADENOIDECTOMY      Family History  Problem Relation Age of Onset  . Diabetes Maternal Grandfather   . Diabetes Paternal Grandmother   . Hypertension Paternal Grandmother   . Heart attack Paternal Grandmother   . Asthma Paternal Grandmother   . CVA Paternal Grandmother   . Leukemia Son   . Heart attack Maternal Grandmother   . Heart Problems Father        arrhythmia  . Rheum arthritis Father   . Hypertension Father   . CAD Father   . Gout Father   . Sleep apnea Father   . Dementia Father   . Asthma Mother   . Osteoporosis Mother   . Hyperthyroidism Sister     Social History   Socioeconomic History  . Marital status: Married    Spouse name: Not on file  .  Number of children: Not on file  . Years of education: Not on file  . Highest education level: Not on file  Occupational History  . Not on file  Social Needs  . Financial resource strain: Not on file  . Food insecurity:    Worry: Not on file    Inability: Not on file  . Transportation needs:    Medical: Not on file    Non-medical: Not on file  Tobacco Use  . Smoking status: Never Smoker  . Smokeless tobacco: Never Used  Substance and Sexual Activity  . Alcohol use: No  . Drug use: No  . Sexual activity: Not on file  Lifestyle  . Physical activity:    Days per week: Not on file    Minutes per session: Not on file  .  Stress: Not on file  Relationships  . Social connections:    Talks on phone: Not on file    Gets together: Not on file    Attends religious service: Not on file    Active member of club or organization: Not on file    Attends meetings of clubs or organizations: Not on file    Relationship status: Not on file  . Intimate partner violence:    Fear of current or ex partner: Not on file    Emotionally abused: Not on file    Physically abused: Not on file    Forced sexual activity: Not on file  Other Topics Concern  . Not on file  Social History Narrative  . Not on file    Review of Systems  Constitutional: Negative for fever.  HENT: Negative.   Eyes: Negative.   Respiratory: Positive for apnea.   Cardiovascular: Negative.   Gastrointestinal: Negative.   Psychiatric/Behavioral: Positive for sleep disturbance.    Vitals:   08/24/18 1623  BP: 132/88  Pulse: 76  SpO2: 97%     Physical Exam  Constitutional: She appears well-developed and well-nourished.  HENT:  Head: Normocephalic and atraumatic.  Mallampati 3  Eyes: Pupils are equal, round, and reactive to light. Conjunctivae and EOM are normal. Right eye exhibits no discharge. Left eye exhibits no discharge.  Neck: Normal range of motion. Neck supple. No tracheal deviation present. No thyromegaly present.  Cardiovascular: Normal rate.  Pulmonary/Chest: Effort normal and breath sounds normal. No respiratory distress. She has no wheezes. She has no rales.  Abdominal: Soft. Bowel sounds are normal. She exhibits no distension. There is no abdominal tenderness.   Home sleep study did reveal an AHI of 9.4 Epworth Sleepiness Scale of 5  Assessment:  Mild obstructive sleep apnea -Denies significant daytime sleepiness, no history of mood disorder, no history of heart disease, no history of CVAs  Obesity -Importance of exercise and weight loss discussed  Pathophysiology of sleep disordered breathing discussed with the  patient  Plan/Recommendations:  Behavioral modifications-encourage lateral sleep position Elevation of the head of the bed by about 30 degrees may help snoring and mild sleep disordered breathing Significant weight loss may also help sleep disordered breathing  If any worsening of symptoms-sleep study may be repeated to ascertain presence of significant sleep disordered breathing  Call with any other concerns  At present, will not pursue treatment of sleep disordered breathing Other treatment options for snoring and mild sleep apnea including oral device discussed with patient   Sherrilyn Rist MD [facility] 08/24/2018, 4:37 PM  CC: Prince Solian, MD

## 2018-08-24 NOTE — Patient Instructions (Signed)
Mild obstructive sleep apnea History of significant snoring  Epworth Sleepiness Scale of 5 No significant comorbidities  Behavioral modifications Elevation of the head of the bed Promoting lateral sleep Exercise and general increasing physical activity/weight loss All may help snoring  I will see you back in the office in about 3 months If your symptoms were to change/worsen-repeat study may be indicated Call with significant concerns

## 2018-11-23 ENCOUNTER — Ambulatory Visit: Payer: BC Managed Care – PPO | Admitting: Pulmonary Disease

## 2018-12-29 ENCOUNTER — Other Ambulatory Visit: Payer: Self-pay

## 2018-12-29 ENCOUNTER — Encounter (HOSPITAL_COMMUNITY): Payer: Self-pay | Admitting: Emergency Medicine

## 2018-12-29 ENCOUNTER — Emergency Department (HOSPITAL_COMMUNITY)
Admission: EM | Admit: 2018-12-29 | Discharge: 2018-12-29 | Disposition: A | Discharge status: Home / Self Care | Payer: BC Managed Care – PPO | Attending: Emergency Medicine | Admitting: Emergency Medicine

## 2018-12-29 ENCOUNTER — Emergency Department (HOSPITAL_COMMUNITY): Payer: BC Managed Care – PPO

## 2018-12-29 DIAGNOSIS — R0602 Shortness of breath: Secondary | ICD-10-CM | POA: Insufficient documentation

## 2018-12-29 DIAGNOSIS — Z79899 Other long term (current) drug therapy: Secondary | ICD-10-CM | POA: Insufficient documentation

## 2018-12-29 DIAGNOSIS — E039 Hypothyroidism, unspecified: Secondary | ICD-10-CM | POA: Diagnosis not present

## 2018-12-29 DIAGNOSIS — Z6830 Body mass index (BMI) 30.0-30.9, adult: Secondary | ICD-10-CM | POA: Insufficient documentation

## 2018-12-29 DIAGNOSIS — R600 Localized edema: Secondary | ICD-10-CM | POA: Insufficient documentation

## 2018-12-29 DIAGNOSIS — E669 Obesity, unspecified: Secondary | ICD-10-CM | POA: Insufficient documentation

## 2018-12-29 DIAGNOSIS — Z7982 Long term (current) use of aspirin: Secondary | ICD-10-CM | POA: Diagnosis not present

## 2018-12-29 LAB — CBC WITH DIFFERENTIAL/PLATELET
Abs Immature Granulocytes: 0.01 10*3/uL (ref 0.00–0.07)
Basophils Absolute: 0 10*3/uL (ref 0.0–0.1)
Basophils Relative: 0 %
Eosinophils Absolute: 0.3 10*3/uL (ref 0.0–0.5)
Eosinophils Relative: 6 %
HCT: 39.1 % (ref 36.0–46.0)
Hemoglobin: 12.6 g/dL (ref 12.0–15.0)
Immature Granulocytes: 0 %
Lymphocytes Relative: 25 %
Lymphs Abs: 1.2 10*3/uL (ref 0.7–4.0)
MCH: 30.7 pg (ref 26.0–34.0)
MCHC: 32.2 g/dL (ref 30.0–36.0)
MCV: 95.1 fL (ref 80.0–100.0)
Monocytes Absolute: 0.3 10*3/uL (ref 0.1–1.0)
Monocytes Relative: 6 %
Neutro Abs: 3 10*3/uL (ref 1.7–7.7)
Neutrophils Relative %: 63 %
Platelets: 261 10*3/uL (ref 150–400)
RBC: 4.11 MIL/uL (ref 3.87–5.11)
RDW: 12.6 % (ref 11.5–15.5)
WBC: 4.8 10*3/uL (ref 4.0–10.5)
nRBC: 0 % (ref 0.0–0.2)

## 2018-12-29 LAB — BASIC METABOLIC PANEL
Anion gap: 8 (ref 5–15)
BUN: 12 mg/dL (ref 6–20)
CO2: 26 mmol/L (ref 22–32)
Calcium: 8.7 mg/dL — ABNORMAL LOW (ref 8.9–10.3)
Chloride: 108 mmol/L (ref 98–111)
Creatinine, Ser: 0.78 mg/dL (ref 0.44–1.00)
GFR calc Af Amer: >60 mL/min (ref >60)
GFR calc non Af Amer: >60 mL/min (ref >60)
Glucose, Bld: 127 mg/dL — ABNORMAL HIGH (ref 70–99)
Potassium: 3.7 mmol/L (ref 3.5–5.1)
Sodium: 142 mmol/L (ref 135–145)

## 2018-12-29 LAB — D-DIMER, QUANTITATIVE: D-Dimer, Quant: <0.27 ug/mL-FEU (ref 0.00–0.50)

## 2018-12-29 LAB — TROPONIN I: Troponin I: <0.03 ng/mL (ref <0.03)

## 2018-12-29 MED ORDER — ALBUTEROL SULFATE HFA 108 (90 BASE) MCG/ACT IN AERS
2.0000 | INHALATION_SPRAY | RESPIRATORY_TRACT | Status: DC | PRN
  Administered 2018-12-29: 2 via RESPIRATORY_TRACT
  Filled 2018-12-29: qty 6.7

## 2018-12-29 NOTE — ED Notes (Signed)
Got patient undress into a gown did ekg shown to Dr Maryan Rued patient is resting with call bell in reach

## 2018-12-29 NOTE — ED Triage Notes (Signed)
Pt reports falling and injuring her left leg, bruising noted to leg and foot, PCP sent her here due to SOB that began Sunday and concerns for blood clot.

## 2018-12-29 NOTE — ED Provider Notes (Signed)
[facility] EMERGENCY DEPARTMENT Provider Note   CSN: [id number] Arrival date & time: 12/29/18  1248    History   Chief Complaint Chief Complaint  Patient presents with   Fall   Shortness of Breath    HPI [name] is a 58 y.o. female.     Patient is a 58 year old female with a history of hypothyroidism and anemia who is presenting today with 3 days of shortness of breath.  Patient states that 11 days ago she was walking in her yard and she tripped over a stick causing her to land on her left leg and twisted her ankle and she did hit her face on the ground.  Her leg has been swollen since that time but is gradually getting better.  She states now her leg does not hurt at all and she is able to walk normally.  However 3 days ago she started developing shortness of breath that she describes as a tightness in her chest that does not allow her to get enough air.  It is worse with exertion and at night when she tries to lay down.  She has had no cough, fever or significant congestion.  She has no chest pain, abdominal pain, nausea or vomiting.  She has never had anything quite like this before but in the past has had some congestion and thought she may have asthma.  She is a non-smoker for life and has no prior lung history.  She has had no recent surgeries or immobilization.  Despite her legs still having some swelling and bruising she states the pain is completely gone.  No family history of heart or clotting disorders.  She herself has no heart trouble.  The history is provided by the patient.  Fall  Associated symptoms include shortness of breath.  Shortness of Breath    Past Medical History:  Diagnosis Date   Anemia    DDD (degenerative disc disease), cervical    DDD (degenerative disc disease), lumbar    Diverticulosis    Hypersomnia 02/05/2018   Hypothyroidism    Hypothyroidism    Liver hemangioma 4/09   Uterine fibroid     Patient Active  Problem List   Diagnosis Date Noted   Hypersomnia 02/05/2018   Morbid obesity ([place]) 02/05/2018   Snoring 02/05/2018    Past Surgical History:  Procedure Laterality Date   BREAST BIOPSY     benign   fibroid tumor     THYROIDECTOMY, PARTIAL     TONSILLECTOMY AND ADENOIDECTOMY       OB History    Gravida  2   Para  2   Term      Preterm      AB      Living  2     SAB      TAB      Ectopic      Multiple      Live Births               Home Medications    Prior to Admission medications   Medication Sig Start Date End Date Taking? Authorizing Provider  aspirin EC 81 MG tablet Take 81 mg by mouth every 6 (six) hours as needed for moderate pain.    [provider]  ibuprofen (ADVIL,MOTRIN) 800 MG tablet Take 1 tablet (800 mg total) by mouth every 8 (eight) hours as needed. 11/12/15   Lawyer, Harrell Gave, PA-C  levothyroxine (SYNTHROID, LEVOTHROID) 88 MCG tablet  TAKE 1 TABLET EVERY OTHER DAY 05/25/13   Yisroel Ramming, Everardo All, MD  meloxicam (MOBIC) 15 MG tablet Take 15 mg by mouth daily as needed for pain.    [provider]  omeprazole (PRILOSEC) 20 MG capsule Take 20 mg by mouth daily as needed (for heartburn).    [provider]  rosuvastatin (CRESTOR) 5 MG tablet Take 5 mg by mouth daily.    [provider]    Family History Family History  Problem Relation Age of Onset   Diabetes Maternal Grandfather    Diabetes Paternal Grandmother    Hypertension Paternal Grandmother    Heart attack Paternal Grandmother    Asthma Paternal Grandmother    CVA Paternal Grandmother    Leukemia Son    Heart attack Maternal Grandmother    Heart Problems Father        arrhythmia   Rheum arthritis Father    Hypertension Father    CAD Father    Gout Father    Sleep apnea Father    Dementia Father    Asthma Mother    Osteoporosis Mother    Hyperthyroidism Sister     Social History Social History    Tobacco Use   Smoking status: Never Smoker   Smokeless tobacco: Never Used  Substance Use Topics   Alcohol use: No   Drug use: No     Allergies   Sulfa antibiotics   Review of Systems Review of Systems  Respiratory: Positive for shortness of breath.   All other systems reviewed and are negative.    Physical Exam Updated Vital Signs BP (!) 164/96 (BP Location: Right Arm)    Pulse 87    Temp 97.8 F (36.6 C) (Oral)    Resp 16    Ht 5' 7.5" (1.715 m)    Wt 90.7 kg    SpO2 99%    BMI 30.86 kg/m   Physical Exam Vitals signs and nursing note reviewed.  Constitutional:      General: She is not in acute distress.    Appearance: She is well-developed. She is obese.  HENT:     Head: Normocephalic and atraumatic.  Eyes:     Pupils: Pupils are equal, round, and reactive to light.  Cardiovascular:     Rate and Rhythm: Normal rate and regular rhythm.     Pulses: Normal pulses.     Heart sounds: Normal heart sounds. No murmur. No friction rub.  Pulmonary:     Effort: Pulmonary effort is normal. No tachypnea or accessory muscle usage.     Breath sounds: Decreased breath sounds present. No wheezing or rales.  Abdominal:     General: Bowel sounds are normal. There is no distension.     Palpations: Abdomen is soft.     Tenderness: There is no abdominal tenderness. There is no guarding or rebound.  Musculoskeletal: Normal range of motion.        General: No tenderness.     Right lower leg: She exhibits no tenderness. No edema.     Left lower leg: She exhibits no tenderness. Edema present.     Comments: Healing ecchymosis present over the left lower extremity from the knee down with mild edema.  No calf pain or fullness.  Skin:    General: Skin is warm and dry.     Findings: No rash.  Neurological:     Mental Status: She is alert and oriented to person, place, and time.  Cranial Nerves: No cranial nerve deficit.  Psychiatric:        Behavior: Behavior normal.       ED Treatments / Results  Labs (all labs ordered are listed, but only abnormal results are displayed) Labs Reviewed  CBC WITH DIFFERENTIAL/PLATELET  BASIC METABOLIC PANEL  D-DIMER, QUANTITATIVE (NOT AT [facility])  TROPONIN I    EKG EKG Interpretation  Date/Time:  Tuesday Dec 29 2018 12:57:56 EDT Ventricular Rate:  86 PR Interval:    QRS Duration: 78 QT Interval:  366 QTC Calculation: 438 R Axis:   62 Text Interpretation:  Sinus rhythm No significant change since last tracing Confirmed by Blanchie Dessert ([id number]) on 12/29/2018 1:15:32 PM   Radiology Dg Chest Port 1 View  Result Date: 12/29/2018 CLINICAL DATA:  Shortness of breath. EXAM: PORTABLE CHEST 1 VIEW COMPARISON:  Radiographs of November 12, 2015. FINDINGS: The heart size and mediastinal contours are within normal limits. Both lungs are clear. No pneumothorax or pleural effusion is noted. The visualized skeletal structures are unremarkable. IMPRESSION: No active disease. Electronically Signed   By: Marijo Conception M.D.   On: 12/29/2018 13:31    Procedures Procedures (including critical care time)  Medications Ordered in ED Medications  albuterol (VENTOLIN HFA) 108 (90 Base) MCG/ACT inhaler 2 puff (2 puffs Inhalation Given 12/29/18 1319)     Initial Impression / Assessment and Plan / ED Course  I have reviewed the triage vital signs and the nursing notes.  Pertinent labs & imaging results that were available during my care of the patient were reviewed by me and considered in my medical decision making (see chart for details).        Patient presenting today with 3 days of shortness of breath.  She has no known heart disease and is a non-smoker.  She does note that 11 days ago she had a fall that resulted in bruising and swelling to the left lower extremity but states it is improving.  She has a normal heart rate and oxygen saturation of 99% on room air.  She denies any infectious symptoms concerning for COVID,  pneumonia or pneumothorax.  Patient does have decreased breath sounds diffusely but no wheezing or rhonchi.  Patient is a low risk Wells criteria but given recent injury to the leg and now shortness of breath will do a d-dimer for further evaluation.  Patient's EKG without acute findings.  She denies any chest pain but symptoms are worse with exertion.  Also concern for possible restrictive lung issue related to allergies.  CBC, BMP,Troponin, chest x-ray and d-dimer pending.  Patient given puff of albuterol inhaler to see if that improves her symptoms.  4:44 PM Patient labs returned via paper chart and had a normal BMP, troponin, d-dimer and CBC.  Patient symptoms improved after albuterol inhaler.  Because patient is having no pain in the leg do not feel that it needs to be x-rayed and do not feel that she needs duplex ultrasound because dimer is negative and she is having no pain.  Discussed follow-up with PCP and using the albuterol inhaler as needed.  NAYLAH CORK was evaluated in Emergency Department on 12/29/2018 for the symptoms described in the history of present illness. She was evaluated in the context of the global COVID-19 pandemic, which necessitated consideration that the patient might be at risk for infection with the SARS-CoV-2 virus that causes COVID-19. Institutional protocols and algorithms that pertain to the evaluation of patients at risk for COVID-19 are  in a state of rapid change based on information released by regulatory bodies including the CDC and federal and state organizations. These policies and algorithms were followed during the patient's care in the ED.   Final Clinical Impressions(s) / ED Diagnoses   Final diagnoses:  Shortness of breath    ED Discharge Orders    None       Blanchie Dessert, MD 12/29/18 1645

## 2018-12-29 NOTE — Discharge Instructions (Signed)
Shortness of breath may be related to allergies or inflammation in your lungs.  Your labs today showed normal blood work with no signs of heart problems and testing for blood clot was normal.  Your EKG and x-ray also looked normal today.  If you start having no symptoms in the future try taking 2 puffs of the albuterol inhaler as needed.

## 2019-02-12 ENCOUNTER — Telehealth: Payer: Self-pay

## 2019-02-12 NOTE — Telephone Encounter (Signed)
Pt called stating that she was seen in the ED for SOB and given and inhaler. She has a f/u w/ Dr. Dagmar Hait on Mon 6/22. He wanted her to make you aware of the SOB and to make sure it is safe for her to use the inhaler.//ah

## 2019-02-15 NOTE — Telephone Encounter (Signed)
Yes and to call us if she is not feeling better. I may have to do a nuclear stress if lungs are okay as routine treadmill stress she had done in Jan 2020 may not be enough.

## 2019-02-16 NOTE — Telephone Encounter (Signed)
Lmom advising.//ah

## 2019-03-22 ENCOUNTER — Other Ambulatory Visit: Payer: Self-pay

## 2019-03-22 ENCOUNTER — Encounter: Payer: Self-pay | Admitting: Internal Medicine

## 2019-03-22 ENCOUNTER — Ambulatory Visit (INDEPENDENT_AMBULATORY_CARE_PROVIDER_SITE_OTHER): Payer: BC Managed Care – PPO | Admitting: Internal Medicine

## 2019-03-22 ENCOUNTER — Ambulatory Visit (INDEPENDENT_AMBULATORY_CARE_PROVIDER_SITE_OTHER): Payer: BC Managed Care – PPO

## 2019-03-22 ENCOUNTER — Institutional Professional Consult (permissible substitution): Payer: BC Managed Care – PPO | Admitting: Internal Medicine

## 2019-03-22 DIAGNOSIS — R0609 Other forms of dyspnea: Secondary | ICD-10-CM

## 2019-03-22 LAB — CBC WITH DIFFERENTIAL/PLATELET
Basophils Absolute: 0 10*3/uL (ref 0.0–0.1)
Basophils Relative: 0.7 % (ref 0.0–3.0)
Eosinophils Absolute: 0.2 10*3/uL (ref 0.0–0.7)
Eosinophils Relative: 3 % (ref 0.0–5.0)
HCT: 42.7 % (ref 36.0–46.0)
Hemoglobin: 14.3 g/dL (ref 12.0–15.0)
Lymphocytes Relative: 20.6 % (ref 12.0–46.0)
Lymphs Abs: 1.1 10*3/uL (ref 0.7–4.0)
MCHC: 33.4 g/dL (ref 30.0–36.0)
MCV: 94.5 fl (ref 78.0–100.0)
Monocytes Absolute: 0.3 10*3/uL (ref 0.1–1.0)
Monocytes Relative: 6.2 % (ref 3.0–12.0)
Neutro Abs: 3.8 10*3/uL (ref 1.4–7.7)
Neutrophils Relative %: 69.5 % (ref 43.0–77.0)
Platelets: 248 10*3/uL (ref 150.0–400.0)
RBC: 4.52 Mil/uL (ref 3.87–5.11)
RDW: 12.6 % (ref 11.5–15.5)
WBC: 5.5 10*3/uL (ref 4.0–10.5)

## 2019-03-22 LAB — BASIC METABOLIC PANEL
BUN: 17 mg/dL (ref 6–23)
CO2: 31 mEq/L (ref 19–32)
Calcium: 9.4 mg/dL (ref 8.4–10.5)
Chloride: 103 mEq/L (ref 96–112)
Creatinine, Ser: 0.86 mg/dL (ref 0.40–1.20)
GFR: 67.78 mL/min (ref >60.00)
Glucose, Bld: 71 mg/dL (ref 70–99)
Potassium: 3.8 mEq/L (ref 3.5–5.1)
Sodium: 141 mEq/L (ref 135–145)

## 2019-03-22 LAB — BRAIN NATRIURETIC PEPTIDE: Pro B Natriuretic peptide (BNP): 58 pg/mL (ref 0.0–100.0)

## 2019-03-22 LAB — TSH: TSH: 0.54 u[IU]/mL (ref 0.35–4.50)

## 2019-03-22 MED ORDER — PANTOPRAZOLE SODIUM 40 MG PO TBEC: DELAYED_RELEASE_TABLET | ORAL | 2 refills | Status: DC

## 2019-03-22 NOTE — Progress Notes (Signed)
[name], female    DOB: 25-Aug-1961,   MRN: [id number]   Brief patient profile:  58 yowf never smoker with croup before elementary school then tonsils out at age 58 due to a recurrent "croup" and seemed 100% better p that and healthy thru adulthood include almost term IUPs x 2  with baseline wt back to 132lb  x years then son had leukemia and yo-yo'd wt  but highest ever in 2020 with variable sense of sob at rest lasting up to an hour maybe once a month at most but never noct assoc with ex limitation assoc with chest tightness  then fell end of April 2020 face first on dirt surface assoc L lower ext pain and swelling then sob > ER 12/29/2018 neg cxr/ ddimer  rx inhaler helped some ? proventil  referred to pulmonary clinic 03/22/2019 by Dr   Dagmar Hait 's Np [facility]  [facility] eval early 2020 c/w mscp but does have mod MR   [facility] prior eval Pos HH     History of Present Illness  03/22/2019  Pulmonary/ 1st office eval/[facility]  Chief Complaint  Patient presents with  . Pulmonary Consult    Referred by Dr. Dagmar Hait. Pt c/o SOB since May 2020- she gets SOB walking "not far" and also up stais.   Dyspnea:  Walking from basement to first floor = 16-18 steps is different now causes and some chest tightness has to rest at top which is new 2020 , some better p symbicort  Same problem mb and back to house is about 50 ft slt hill back to house much  worse with talking  Cough: only p exertion, dry  Sleep: feels like chest is "crushing" when lies down at hs flat on back / symbicort has not corrected this symptom nor has hs ppi SABA use: no longer using    No obvious day to day or daytime variability or assoc excess/ purulent sputum or mucus plugs or hemoptysis, subjective wheeze or overt sinus or hb symptoms.   Sleeping: does fine off back  without nocturnal  or early am exacerbation  of respiratory  c/o's or need for noct saba. Also denies any obvious fluctuation of symptoms with weather or environmental changes or  other aggravating or alleviating factors except as outlined above   No unusual exposure hx or h/o  knowledge of premature birth.  Current Allergies, Complete Past Medical History, Past Surgical History, Family History, and Social History were reviewed in [employer] record.  ROS  The following are not active complaints unless bolded Hoarseness, sore throat, dysphagia, dental problems, itching, sneezing,  nasal congestion or discharge of excess mucus or purulent secretions, ear ache,   fever, chills, sweats, unintended wt loss or wt gain, classically pleuritic ,  orthopnea pnd or arm/hand swelling  or leg swelling, presyncope, palpitations, abdominal pain, anorexia, nausea, vomiting, diarrhea  or change in bowel habits or change in bladder habits, change in stools or change in urine, dysuria, hematuria,  rash, arthralgias, visual complaints, headache, numbness, weakness or ataxia or problems with walking or coordination,  change in mood or  memory.              Past Medical History:  Diagnosis Date  . Anemia   . DDD (degenerative disc disease), cervical   . DDD (degenerative disc disease), lumbar   . Diverticulosis   . Hypersomnia 02/05/2018  . Hypothyroidism   . Hypothyroidism   . Liver hemangioma 4/09  .  Uterine fibroid     Outpatient Medications Prior to Visit  Medication Sig Dispense Refill  . Budesonide-Formoterol Fumarate (SYMBICORT IN) Inhale 2 puffs into the lungs 2 (two) times a day. Unsure of strength    . levothyroxine (SYNTHROID) 100 MCG tablet Take 100 mcg by mouth daily before breakfast.    . meloxicam (MOBIC) 15 MG tablet Take 15 mg by mouth daily as needed for pain.     Marland Kitchen omeprazole (PRILOSEC) 20 MG capsule Take 20 mg by mouth daily as needed (for heartburn).    . pravastatin (PRAVACHOL) 20 MG tablet Take 20 mg by mouth daily.    .       . levothyroxine (SYNTHROID, LEVOTHROID) 88 MCG tablet TAKE 1 TABLET EVERY OTHER DAY (Patient taking  differently: Take 100 mcg by mouth daily before breakfast. ) 15 tablet 0  . rosuvastatin (CRESTOR) 20 MG tablet Take 20 mg by mouth daily.           Objective:     BP 118/80 (BP Location: Left Arm, Cuff Size: Normal)   Pulse 89   Temp (!) 97.5 F (36.4 C) (Oral)   Ht 5' 7.5" (1.715 m)   Wt 201 lb (91.2 kg)   SpO2 97%   BMI 31.02 kg/m   SpO2: 97 %  RA  Very pleasant mod obese wf nad  HEENT: nl dentition, turbinates bilaterally, and oropharynx. Nl external ear canals without cough reflex   NECK :  without JVD/Nodes/TM/ nl carotid upstrokes bilaterally   LUNGS: no acc muscle use,  Nl contour chest which is clear to A and P bilaterally without cough on insp or exp maneuvers   CV:  RRR  no s3 or murmur or increase in P2, and no edema   ABD:  Obese soft and nontender with nl inspiratory excursion in the supine position. No bruits or organomegaly appreciated, bowel sounds nl  MS:  Nl gait/ ext warm without deformities, calf tenderness, cyanosis or clubbing No obvious joint restrictions   SKIN: warm and dry without lesions    NEURO:  alert, approp, nl sensorium with  no motor or cerebellar deficits apparent.     CXR PA and Lateral:   03/22/2019 :    I personally reviewed images and agree with radiology impression as follows:   No acute cardiopulmonary disease.  Labs ordered/ reviewed:      Chemistry      Component Value Date/Time   NA 141 03/22/2019 1207   K 3.8 03/22/2019 1207   CL 103 03/22/2019 1207   CO2 31 03/22/2019 1207   BUN 17 03/22/2019 1207   CREATININE 0.86 03/22/2019 1207      Component Value Date/Time   CALCIUM 9.4 03/22/2019 1207                              Lab Results  Component Value Date   WBC 5.5 03/22/2019   HGB 14.3 03/22/2019   HCT 42.7 03/22/2019   MCV 94.5 03/22/2019   PLT 248.0 03/22/2019       EOS                                                               0.2  03/22/2019   Lab  Results  Component Value Date   DDIMER <0.27 12/29/2018      Lab Results  Component Value Date   TSH 0.54 03/22/2019     Lab Results  Component Value Date   PROBNP 58.0 03/22/2019                  Assessment   DOE (dyspnea on exertion) Onset 2020 with highest wt 201 vs baseline 132  - [facility] eval 08/2018 c/w mod MR and mscp - 03/22/2019   Walked RA  2 laps @  approx 273ft each @ very fast pace  stopped due to  End of study, min sob and lowest sats = 99%  - 03/22/2019  After extensive coaching inhaler device,  effectiveness = 25% from a baseline of 0   Symptoms are markedly disproportionate to objective findings and not clear to what extent this is actually a pulmonary  problem but pt does appear to have difficult to sort out respiratory symptoms of unknown origin for which  DDX  = almost all start with A and  include Adherence, Ace Inhibitors, Acid Reflux, Active Sinus Disease, Alpha 1 Antitripsin deficiency, Anxiety masquerading as Airways dz,  ABPA,  Allergy(esp in young), Aspiration (esp in elderly), Adverse effects of meds,  Active smoking or Vaping, A bunch of PE's/clot burden (a few small clots can't cause this syndrome unless there is already severe underlying pulm or vascular dz with poor reserve),  Anemia or thyroid disorder, plus two Bs  = Bronchiectasis and Beta blocker use..and one C= CHF     Adherence is always the initial "prime suspect" and is a multilayered concern that requires a "trust but verify" approach in every patient - starting with knowing how to use medications, especially inhalers, correctly, keeping up with refills and understanding the fundamental difference between maintenance and prns vs those medications only taken for a very short course and then stopped and not refilled.  - see hfa teaching - I strongly doubt she was getting any benefit from symbicort used in this way - return with all meds in hand using a trust but verify approach to confirm  accurate Medication  Reconciliation The principal here is that until we are certain that the  patients are doing what we've asked, it makes no sense to ask them to do more.    ? Acid (or non-acid) GERD > always difficult to exclude as up to 75% of pts in some series report no assoc GI/ Heartburn symptoms and has HH with atypical cp with severe noct component > rec max (24h)  acid suppression and diet restrictions/bed blocks >>  reviewed and instructions given in writing.   ? Allergy /asthma > continue symb 80 2bid for now but highly doubtful this is really helping    ? Anxiety/depression/ wt gain and deconditioning  > usually at the bottom of this list of usual suspects but should be included here esp with what she's going thru with her brother and may explain her h/o resting sob (panic disorder)   and may interfere with adherence and also interpretation of response or lack thereof to symptom management which can be quite subjective.   ? Anemia/ thyroid dz > ruled out  ? A Bunch of PE's >  D dimer nl - while a normal   l value   may miss small peripheral pe, the clot burden with sob is moderately high and the d dimer  has a very high  neg pred value if used in this setting.    ? Adverse drug effects > none of the usual suspects listed  ? CHF > I suppose MR could be playing some role in sob/doe but certainly not the atypical cp and note the overuse of nsaids for the cp may have been what caused the GI problems I suspect may be the source of her cp and sob (see above)    >>>>  rec max rx for gerd, continue symbicort 80 for now, consider cpst which would included before and after spirometry if pattern continues at f/u ov    Total time devoted to counseling  > 50 % of initial 60 min office visit:  reviewed case with pt/ directly observed portions of ambulatory 02 saturation study/  performed device teaching  using a teach back technique both of which   extended face to face time for this visit (see  above)  discussion of options/alternatives/ personally creating written customized instructions  in presence of pt  then going over those specific  Instructions directly with the pt including how to use all of the meds but in particular covering each new medication in detail and the difference between the maintenance= "automatic" meds and the prns using an action plan format for the latter (If this problem/symptom => do that organization reading Left to right).  Please see AVS from this visit for a full list of these instructions which I personally wrote for this pt and  are unique to this visit.      Christinia Gully, MD 03/22/2019

## 2019-03-22 NOTE — Patient Instructions (Addendum)
Please remember to go to the lab and x-ray department   for your tests - we will call you with the results when they are available.     Continue symbicort Take 2 puffs first thing in am and then another 2 puffs about 12 hours later.    Work on inhaler technique:  relax and gently blow all the way out then take a nice smooth deep breath back in, triggering the inhaler at same time you start breathing in.  Hold for up to 5 seconds if you can. Blow out thru nose. Rinse and gargle with water when done     Stop prilosec and start protonix 40 mg Take 30- 60 min before your first and last meals of the day    GERD (REFLUX)  is an extremely common cause of respiratory symptoms just like yours , many times with no obvious heartburn at all.    It can be treated with medication, but also with lifestyle changes including elevation of the head of your bed (ideally with 6 -8inch blocks under the headboard of your bed),  Smoking cessation, avoidance of late meals, excessive alcohol, and avoid fatty foods, chocolate, peppermint, colas, red wine, and acidic juices such as orange juice.  NO MINT OR MENTHOL PRODUCTS SO NO COUGH DROPS  USE SUGARLESS CANDY INSTEAD (Jolley ranchers or Stover's or Life Savers) or even ice chips will also do - the key is to swallow to prevent all throat clearing. NO OIL BASED VITAMINS - use powdered substitutes.  Avoid fish oil when coughing.   Add:  F/u in 6 weeks

## 2019-03-23 ENCOUNTER — Encounter: Payer: Self-pay | Admitting: Internal Medicine

## 2019-03-23 NOTE — Progress Notes (Signed)
ATC, NA and VM not set up yet

## 2019-03-23 NOTE — Progress Notes (Signed)
ATC, NA and no VM set up yet

## 2019-03-23 NOTE — Assessment & Plan Note (Signed)
Onset 2020 with highest wt 201 vs baseline 132  - [facility] eval 08/2018 c/w mod MR and mscp - 03/22/2019   Walked RA  2 laps @  approx 230ft each @ very fast pace  stopped due to  End of study, min sob and lowest sats = 99%  - 03/22/2019  After extensive coaching inhaler device,  effectiveness = 25% from a baseline of 0   Symptoms are markedly disproportionate to objective findings and not clear to what extent this is actually a pulmonary  problem but pt does appear to have difficult to sort out respiratory symptoms of unknown origin for which  DDX  = almost all start with A and  include Adherence, Ace Inhibitors, Acid Reflux, Active Sinus Disease, Alpha 1 Antitripsin deficiency, Anxiety masquerading as Airways dz,  ABPA,  Allergy(esp in young), Aspiration (esp in elderly), Adverse effects of meds,  Active smoking or Vaping, A bunch of PE's/clot burden (a few small clots can't cause this syndrome unless there is already severe underlying pulm or vascular dz with poor reserve),  Anemia or thyroid disorder, plus two Bs  = Bronchiectasis and Beta blocker use..and one C= CHF     Adherence is always the initial "prime suspect" and is a multilayered concern that requires a "trust but verify" approach in every patient - starting with knowing how to use medications, especially inhalers, correctly, keeping up with refills and understanding the fundamental difference between maintenance and prns vs those medications only taken for a very short course and then stopped and not refilled.  - see hfa teaching - I strongly doubt she was getting any benefit from symbicort used in this way - return with all meds in hand using a trust but verify approach to confirm accurate Medication  Reconciliation The principal here is that until we are certain that the  patients are doing what we've asked, it makes no sense to ask them to do more.    ? Acid (or non-acid) GERD > always difficult to exclude as up to 75% of pts in some series  report no assoc GI/ Heartburn symptoms and has HH with atypical cp with severe noct component > rec max (24h)  acid suppression and diet restrictions/bed blocks >>  reviewed and instructions given in writing.   ? Allergy /asthma > continue symb 80 2bid for now but highly doubtful this is really helping    ? Anxiety/depression/ wt gain and deconditioning  > usually at the bottom of this list of usual suspects but should be included here esp with what she's going thru with her brother and may explain her h/o resting sob (panic disorder)   and may interfere with adherence and also interpretation of response or lack thereof to symptom management which can be quite subjective.   ? Anemia/ thyroid dz > ruled out  ? A Bunch of PE's >  D dimer nl - while a normal   l value   may miss small peripheral pe, the clot burden with sob is moderately high and the d dimer  has a very high neg pred value if used in this setting.    ? Adverse drug effects > none of the usual suspects listed  ? CHF > I suppose MR could be playing some role in sob/doe but certainly not the atypical cp and note the overuse of nsaids for the cp may have been what caused the GI problems I suspect may be the source of her cp and sob (  see above)    >>>>  rec max rx for gerd, continue symbicort 80 for now, consider cpst which would included before and after spirometry if pattern continues at f/u ov    Total time devoted to counseling  > 50 % of initial 60 min office visit:  reviewed case with pt/ directly observed portions of ambulatory 02 saturation study/  performed device teaching  using a teach back technique both of which   extended face to face time for this visit (see above)  discussion of options/alternatives/ personally creating written customized instructions  in presence of pt  then going over those specific  Instructions directly with the pt including how to use all of the meds but in particular covering each new medication in  detail and the difference between the maintenance= "automatic" meds and the prns using an action plan format for the latter (If this problem/symptom => do that organization reading Left to right).  Please see AVS from this visit for a full list of these instructions which I personally wrote for this pt and  are unique to this visit.

## 2019-03-24 ENCOUNTER — Telehealth: Payer: Self-pay | Admitting: Internal Medicine

## 2019-03-24 NOTE — Telephone Encounter (Signed)
Advised pt of results. Pt understood and nothing further is needed.    Notes recorded by Tanda Rockers, MD on 03/23/2019 at 7:03 AM EDT  Call pt: Reviewed cxr and no acute change so no change in recommendations made at ov     Notes recorded by Tanda Rockers, MD on 03/22/2019 at 4:55 PM EDT  Call patient : Studies are unremarkable, no change in recs

## 2019-04-06 ENCOUNTER — Encounter: Payer: Self-pay | Admitting: Internal Medicine

## 2019-04-23 ENCOUNTER — Ambulatory Visit: Payer: BC Managed Care – PPO | Admitting: Internal Medicine

## 2019-04-26 ENCOUNTER — Other Ambulatory Visit: Payer: Self-pay

## 2019-04-26 ENCOUNTER — Ambulatory Visit: Payer: BC Managed Care – PPO | Admitting: Internal Medicine

## 2019-04-26 ENCOUNTER — Encounter: Payer: Self-pay | Admitting: Internal Medicine

## 2019-04-26 DIAGNOSIS — R0683 Snoring: Secondary | ICD-10-CM

## 2019-04-26 DIAGNOSIS — K219 Gastro-esophageal reflux disease without esophagitis: Secondary | ICD-10-CM | POA: Diagnosis not present

## 2019-04-26 DIAGNOSIS — R0609 Other forms of dyspnea: Secondary | ICD-10-CM | POA: Diagnosis not present

## 2019-04-26 DIAGNOSIS — R079 Chest pain, unspecified: Secondary | ICD-10-CM | POA: Diagnosis not present

## 2019-04-26 MED ORDER — RABEPRAZOLE SODIUM 20 MG PO TBEC: DELAYED_RELEASE_TABLET | ORAL | 2 refills | Status: DC

## 2019-04-26 NOTE — Progress Notes (Signed)
[name], female    DOB: 28-Feb-1961    MRN: [id number]   Brief patient profile:  18 yowf never smoker with croup before elementary school then tonsils out at age 58 due to a recurrent "croup" and seemed 100% better p that and healthy thru adulthood include almost term IUPs x 2  with baseline wt back to 132lb  x years then son had leukemia and yo-yo'd wt  but highest ever in 2020 with variable sense of sob at rest lasting up to an hour maybe once a month at most but never noct assoc with ex limitation assoc with chest tightness  then fell end of April 2020 face first on dirt surface assoc L lower ext pain and swelling then sob > ER 12/29/2018 neg cxr/ ddimer  rx inhaler helped some ? proventil  referred to pulmonary clinic 03/22/2019 by Dr   Dagmar Hait 's Np [facility]rice [facility]  [facility] eval early 2020 c/w mscp but does have mod MR   [facility] prior eval Pos HH     History of Present Illness  03/22/2019  Pulmonary/ 1st office eval/Corda Shutt  Chief Complaint  Patient presents with   Pulmonary Consult    Referred by Dr. Dagmar Hait. Pt c/o SOB since May 2020- she gets SOB walking "not far" and also up stais.   Dyspnea:  Walking from basement to first floor = 16-18 steps is different now causes and some chest tightness has to rest at top which is new 2020 , some better p symbicort  Same problem mb and back to house is about 50 ft slt hill back to house much  worse with talking  Cough: only p exertion, dry  Sleep: feels like chest is "crushing" when lies down at hs flat on back / symbicort has not corrected this symptom nor has hs ppi rec Please remember to go to the lab and x-ray department   for your tests - we will call you with the results when they are available.  Continue symbicort Take 2 puffs first thing in am and then another 2 puffs about 12 hours later.  Work on inhaler technique:   Stop prilosec and start protonix 40 mg Take 30- 60 min before your first and last meals of the day  GERD diet   04/26/2019  f/u  ov/Daichi Moris re: cp and sob resolved on gerd rx/  Off symbicort x early Aug 2020 and on ppi bid immediately ac as can't tol protonix o/w = abd cramps Chief Complaint  Patient presents with   Follow-up    Breathing has improved. No new co's.   Dyspnea:  Do steps better now, mb and back better  Cough: resolved to her satisfaction   Sleeping: noct cp gone still flat on water bed / husband complains of snoring / not resting well but no  excess drowsiness  SABA use: none  02: none    No obvious day to day or daytime variability or assoc excess/ purulent sputum or mucus plugs or hemoptysis or cp or chest tightness, subjective wheeze or overt sinus or hb symptoms.   She denies being aware of   nocturnal  or early am exacerbation  of respiratory  c/o's or need for noct saba. Also denies any obvious fluctuation of symptoms with weather or environmental changes or other aggravating or alleviating factors except as outlined above   No unusual exposure hx or h/o childhood pna/ asthma or knowledge of premature birth.  Current Allergies, Complete Past Medical History,  Past Surgical History, Family History, and Social History were reviewed in [employer] record.  ROS  The following are not active complaints unless bolded Hoarseness, sore throat, dysphagia, dental problems, itching, sneezing,  nasal congestion or discharge of excess mucus or purulent secretions, ear ache,   fever, chills, sweats, unintended wt loss or wt gain, classically pleuritic or exertional cp,  orthopnea pnd or arm/hand swelling  or leg swelling, presyncope, palpitations, abdominal pain better if takes protonix immediately before eats, anorexia, nausea, vomiting, diarrhea  or change in bowel habits or change in bladder habits, change in stools or change in urine, dysuria, hematuria,  rash, arthralgias, visual complaints, headache, numbness, weakness or ataxia or problems with walking or coordination,  change in mood or   memory.        Current Meds  Medication Sig   Budesonide-Formoterol Fumarate (SYMBICORT IN) Inhale 2 puffs into the lungs 2 (two) times a day. Unsure of strength   levothyroxine (SYNTHROID) 100 MCG tablet Take 100 mcg by mouth daily before breakfast.   meloxicam (MOBIC) 15 MG tablet Take 15 mg by mouth daily as needed for pain.    pantoprazole (PROTONIX) 40 MG tablet Take 30- 60 min before your first and last meals of the day   pravastatin (PRAVACHOL) 20 MG tablet Take 20 mg by mouth daily.        Past Medical History:  Diagnosis Date   Anemia    DDD (degenerative disc disease), cervical    DDD (degenerative disc disease), lumbar    Diverticulosis    Hypersomnia 02/05/2018   Hypothyroidism    Hypothyroidism    Liver hemangioma 4/09   Uterine fibroid         Objective:      amb somber wf nad   Wt Readings from Last 3 Encounters:  04/26/19 201 lb (91.2 kg)  03/22/19 201 lb (91.2 kg)  12/29/18 200 lb (90.7 kg)     Vital signs reviewed - Note on arrival 02 sats  97% on RA     HEENT: nl dentition, turbinates bilaterally, and oropharynx. Nl external ear canals without cough reflex   NECK :  without JVD/Nodes/TM/ nl carotid upstrokes bilaterally   LUNGS: no acc muscle use,  Nl contour chest which is clear to A and P bilaterally without cough on insp or exp maneuvers   CV:  RRR  no s3 or murmur or increase in P2, and no edema   ABD:  soft and nontender with nl inspiratory excursion in the supine position. No bruits or organomegaly appreciated, bowel sounds nl  MS:  Nl gait/ ext warm without deformities, calf tenderness, cyanosis or clubbing No obvious joint restrictions   SKIN: warm and dry without lesions    NEURO:  alert, approp, nl sensorium with  no motor or cerebellar deficits apparent.          Assessment

## 2019-04-26 NOTE — Patient Instructions (Addendum)
Ok to change protonix to aciphex 20 mg Take 30- 60 min before your first and last meals of the day x 3 months and be sure to see Collene Mares and describe your symptoms and response to therapy.   You have moderate mitral regurgitation per Dr Mila Palmer notes and you need to be sure to touch bases with him before any dental procedure and if your breathing is getting worse  - keeping your wt and blood pressure down are key   Stay off your back when sleeping    Set up follow snoring eval by Dr Ander Slade

## 2019-04-27 ENCOUNTER — Encounter: Payer: Self-pay | Admitting: Internal Medicine

## 2019-04-27 DIAGNOSIS — R079 Chest pain, unspecified: Secondary | ICD-10-CM | POA: Insufficient documentation

## 2019-04-27 NOTE — Assessment & Plan Note (Addendum)
[place] home sleep study  by Dr Chancy Milroy 2019 - reviewed by [facility] 08/24/2018:  Home sleep study did reveal an AHI of 9.4  Epworth Sleepiness Scale of 5 rec position changes / wt loss   Reviewed Dr Judson Roch recs with pt, apparently husband not happy with results, pt does have mild daytime drowsiness and has not had much success with wt loss.   rec referred back to [facility]    I had an extended discussion with the patient reviewing all relevant studies completed to date and  lasting 15 to 20 minutes of a 25  minute summary final f/u office visit    Each maintenance medication was reviewed in detail including most importantly the difference between maintenance and prns and under what circumstances the prns are to be triggered using an action plan format that is not reflected in the computer generated alphabetically organized AVS.     Please see AVS for specific instructions unique to this visit that I personally wrote and verbalized to the the pt in detail and then reviewed with pt  by my nurse highlighting any  changes in therapy recommended at today's visit to their plan of care.

## 2019-04-27 NOTE — Assessment & Plan Note (Signed)
Onset 2020 with highest wt 201 vs baseline 132  - [facility] eval 08/2018 c/w mod MR and mscp - 03/22/2019   Walked RA  2 laps @  approx 250ft each @ very fast pace  stopped due to  End of study, min sob and lowest sats = 99%  - 03/22/2019  After extensive coaching inhaler device,  effectiveness = 25% from a baseline of 0 > try off symbicort > better on gerd rx 04/26/2019   Cp and sob better just on rx for gerd and off all resp rx so rec f/u with GI/ cards but here prn.

## 2019-04-27 NOTE — Assessment & Plan Note (Signed)
Resolved as did sob and cough on ppi as of 04/26/2019   - changed protonix to aciphex for better GI tol >> f/u DR Collene Mares w/in 3 mo

## 2019-05-18 ENCOUNTER — Institutional Professional Consult (permissible substitution): Payer: BC Managed Care – PPO | Admitting: Pulmonary Disease

## 2019-06-17 ENCOUNTER — Other Ambulatory Visit: Payer: Self-pay | Admitting: Internal Medicine

## 2019-06-17 DIAGNOSIS — R0609 Other forms of dyspnea: Secondary | ICD-10-CM

## 2019-06-23 ENCOUNTER — Institutional Professional Consult (permissible substitution): Payer: BC Managed Care – PPO | Admitting: Pulmonary Disease

## 2019-09-21 ENCOUNTER — Other Ambulatory Visit: Payer: Self-pay | Admitting: Internal Medicine

## 2019-10-04 ENCOUNTER — Other Ambulatory Visit: Payer: Self-pay | Admitting: Internal Medicine

## 2019-10-05 NOTE — Telephone Encounter (Signed)
See last ov changed protonix to aciphex for better GI tol >> f/u DR Collene Mares w/in 3 mo  - ok to give enough until she sees Dr Collene Mares

## 2019-10-05 NOTE — Telephone Encounter (Signed)
Dr. Melvyn Novas, please advise if you are okay with [place] refilling med for pt.

## 2020-01-06 ENCOUNTER — Other Ambulatory Visit: Payer: Self-pay | Admitting: Internal Medicine

## 2020-12-14 ENCOUNTER — Encounter (HOSPITAL_BASED_OUTPATIENT_CLINIC_OR_DEPARTMENT_OTHER): Payer: Self-pay

## 2020-12-14 ENCOUNTER — Emergency Department (HOSPITAL_BASED_OUTPATIENT_CLINIC_OR_DEPARTMENT_OTHER)
Admission: EM | Admit: 2020-12-14 | Discharge: 2020-12-14 | Disposition: A | Discharge status: Home / Self Care | Payer: BC Managed Care – PPO | Attending: Emergency Medicine | Admitting: Emergency Medicine

## 2020-12-14 ENCOUNTER — Other Ambulatory Visit: Payer: Self-pay

## 2020-12-14 ENCOUNTER — Emergency Department (HOSPITAL_BASED_OUTPATIENT_CLINIC_OR_DEPARTMENT_OTHER): Payer: BC Managed Care – PPO

## 2020-12-14 ENCOUNTER — Emergency Department (HOSPITAL_BASED_OUTPATIENT_CLINIC_OR_DEPARTMENT_OTHER): Payer: BC Managed Care – PPO | Admitting: Radiology

## 2020-12-14 DIAGNOSIS — E039 Hypothyroidism, unspecified: Secondary | ICD-10-CM | POA: Insufficient documentation

## 2020-12-14 DIAGNOSIS — Z79899 Other long term (current) drug therapy: Secondary | ICD-10-CM | POA: Insufficient documentation

## 2020-12-14 DIAGNOSIS — R0789 Other chest pain: Secondary | ICD-10-CM | POA: Insufficient documentation

## 2020-12-14 DIAGNOSIS — R079 Chest pain, unspecified: Secondary | ICD-10-CM

## 2020-12-14 LAB — LIPASE, BLOOD: Lipase: 31 U/L (ref 11–51)

## 2020-12-14 LAB — COMPREHENSIVE METABOLIC PANEL
ALT: 23 U/L (ref 0–44)
AST: 21 U/L (ref 15–41)
Albumin: 4 g/dL (ref 3.5–5.0)
Alkaline Phosphatase: 78 U/L (ref 38–126)
Anion gap: 7 (ref 5–15)
BUN: 16 mg/dL (ref 6–20)
CO2: 31 mmol/L (ref 22–32)
Calcium: 9 mg/dL (ref 8.9–10.3)
Chloride: 104 mmol/L (ref 98–111)
Creatinine, Ser: 0.78 mg/dL (ref 0.44–1.00)
GFR, Estimated: >60 mL/min (ref >60)
Glucose, Bld: 94 mg/dL (ref 70–99)
Potassium: 3.9 mmol/L (ref 3.5–5.1)
Sodium: 142 mmol/L (ref 135–145)
Total Bilirubin: 0.7 mg/dL (ref 0.3–1.2)
Total Protein: 6.7 g/dL (ref 6.5–8.1)

## 2020-12-14 LAB — CBC
HCT: 42.4 % (ref 36.0–46.0)
Hemoglobin: 14 g/dL (ref 12.0–15.0)
MCH: 31.3 pg (ref 26.0–34.0)
MCHC: 33 g/dL (ref 30.0–36.0)
MCV: 94.9 fL (ref 80.0–100.0)
Platelets: 240 10*3/uL (ref 150–400)
RBC: 4.47 MIL/uL (ref 3.87–5.11)
RDW: 12.2 % (ref 11.5–15.5)
WBC: 4.8 10*3/uL (ref 4.0–10.5)
nRBC: 0 % (ref 0.0–0.2)

## 2020-12-14 LAB — TROPONIN I (HIGH SENSITIVITY)
Troponin I (High Sensitivity): 8 ng/L (ref <18)
Troponin I (High Sensitivity): 10 ng/L (ref <18)

## 2020-12-14 MED ORDER — IOHEXOL 350 MG/ML SOLN
75.0000 mL | Freq: Once | INTRAVENOUS | Status: AC | PRN
  Administered 2020-12-14: 75 mL via INTRAVENOUS

## 2020-12-14 MED ORDER — SUCRALFATE 1 G PO TABS: 1.0000 g | ORAL_TABLET | Freq: Three times a day (TID) | ORAL | 0 refills | Status: DC

## 2020-12-14 NOTE — ED Notes (Signed)
Patient ambulated to restroom without difficulty.

## 2020-12-14 NOTE — ED Provider Notes (Signed)
[facility] EMER[facility]ENCY DEPT Provider Note   CSN: [id number] Arrival date & time: 12/14/20  1154     History Chief Complaint  Patient presents with  . Chest Pain    Fatina NADJA LINA is a 60 y.o. female.  HPI  HPI: A 60 year old patient with a history of hypercholesterolemia presents for evaluation of chest pain. Initial onset of pain was more than 6 hours ago. The patient's chest pain is described as heaviness/pressure/tightness and is not worse with exertion. The patient complains of nausea. The patient's chest pain is middle- or left-sided, is not well-localized, is not sharp and does not radiate to the arms/jaw/neck. The patient denies diaphoresis. The patient has no history of stroke, has no history of peripheral artery disease, has not smoked in the past 90 days, denies any history of treated diabetes, has no relevant family history of coronary artery disease (first degree relative at less than age 65), is not hypertensive and does not have an elevated BMI (>=30).  Patient states she has been having these issues for the last several weeks now.  It has gotten significantly more severe in the last week.  Patient feels a crushing type pressure in her chest.  It radiates to her back.  Initially was intermittent but now has been more constant for the past week.  It never goes away.  Patient called her doctor and was instructed to come to the ED to be evaluated.  She was told she needed a CAT scan Past Medical History:  Diagnosis Date  . Anemia   . DDD (degenerative disc disease), cervical   . DDD (degenerative disc disease), lumbar   . Diverticulosis   . Hypersomnia 02/05/2018  . Hypothyroidism   . Hypothyroidism   . Liver hemangioma 4/09  . Uterine fibroid     Patient Active Problem List   Diagnosis Date Noted  . Chest pain due to GERD 04/27/2019  . DOE (dyspnea on exertion) 03/22/2019  . Hypersomnia 02/05/2018  . Snoring 02/05/2018    Past Surgical History:   Procedure Laterality Date  . BREAST BIOPSY     benign  . fibroid tumor    . THYROIDECTOMY, PARTIAL    . TONSILLECTOMY AND ADENOIDECTOMY       OB History    Gravida  2   Para  2   Term      Preterm      AB      Living  2     SAB      IAB      Ectopic      Multiple      Live Births              Family History  Problem Relation Age of Onset  . Diabetes Maternal Grandfather   . Diabetes Paternal Grandmother   . Hypertension Paternal Grandmother   . Heart attack Paternal Grandmother   . Asthma Paternal Grandmother   . CVA Paternal Grandmother   . Leukemia Son   . Heart attack Maternal Grandmother   . Heart Problems Father        arrhythmia  . Rheum arthritis Father   . Hypertension Father   . CAD Father   . Gout Father   . Sleep apnea Father   . Dementia Father   . Asthma Mother   . Osteoporosis Mother   . Hyperthyroidism Sister     Social History   Tobacco Use  . Smoking status: Never Smoker  .  Smokeless tobacco: Never Used  Vaping Use  . Vaping Use: Never used  Substance Use Topics  . Alcohol use: No  . Drug use: No    Home Medications Prior to Admission medications   Medication Sig Start Date End Date Taking? Authorizing Provider  levothyroxine (SYNTHROID) 100 MCG tablet Take 100 mcg by mouth daily before breakfast.    [provider]  meloxicam (MOBIC) 15 MG tablet Take 15 mg by mouth daily as needed for pain.     [provider]  pantoprazole (PROTONIX) 40 MG tablet TAKE 30- 60 MIN BEFORE YOUR FIRST AND LAST MEALS OF THE DAY 06/17/19   Tanda Rockers, MD  pravastatin (PRAVACHOL) 20 MG tablet Take 20 mg by mouth daily.    [provider]  RABEprazole (ACIPHEX) 20 MG tablet TAKE 1 TABLET BY MOUTH 30- 60 MIN BEFORE YOUR FIRST AND LAST MEALS OF THE DAY 10/05/19   Tanda Rockers, MD    Allergies    Sulfa antibiotics  Review of Systems   Review of Systems  All other systems reviewed and are  negative.   Physical Exam Updated Vital Signs BP (!) 142/75   Pulse (!) 59   Temp 97.7 F (36.5 C) (Oral)   Resp 11   SpO2 100%   Physical Exam Vitals and nursing note reviewed.  Constitutional:      General: She is not in acute distress.    Appearance: She is well-developed.  HENT:     Head: Normocephalic and atraumatic.     Right Ear: External ear normal.     Left Ear: External ear normal.  Eyes:     General: No scleral icterus.       Right eye: No discharge.        Left eye: No discharge.     Conjunctiva/sclera: Conjunctivae normal.  Neck:     Trachea: No tracheal deviation.  Cardiovascular:     Rate and Rhythm: Normal rate and regular rhythm.  Pulmonary:     Effort: Pulmonary effort is normal. No respiratory distress.     Breath sounds: Normal breath sounds. No stridor. No wheezing or rales.  Abdominal:     General: Bowel sounds are normal. There is no distension.     Palpations: Abdomen is soft.     Tenderness: There is no abdominal tenderness. There is no guarding or rebound.  Musculoskeletal:        General: No tenderness.     Cervical back: Neck supple.  Skin:    General: Skin is warm and dry.     Findings: No rash.  Neurological:     Mental Status: She is alert.     Cranial Nerves: No cranial nerve deficit (no facial droop, extraocular movements intact, no slurred speech).     Sensory: No sensory deficit.     Motor: No abnormal muscle tone or seizure activity.     Coordination: Coordination normal.     ED Results / Procedures / Treatments   Labs (all labs ordered are listed, but only abnormal results are displayed) Labs Reviewed  CBC  COMPREHENSIVE METABOLIC PANEL  LIPASE, BLOOD  TROPONIN I (HIGH SENSITIVITY)  TROPONIN I (HIGH SENSITIVITY)    EKG EKG Interpretation  Date/Time:  Thursday December 14 2020 12:10:32 EDT Ventricular Rate:  68 PR Interval:  153 QRS Duration: 74 QT Interval:  394 QTC Calculation: 419 R Axis:   86 Text  Interpretation: Sinus rhythm Atrial premature complex Abnormal R-wave progression, early  transition , new compared to last tracing in 2017 Confirmed by Dorie Rank ([id number]) on 12/14/2020 12:26:07 PM   Radiology DG Chest 2 View  Result Date: 12/14/2020 CLINICAL DATA:  Chest pain. EXAM: CHEST - 2 VIEW COMPARISON:  03/22/2019. FINDINGS: Mediastinum and hilar structures normal. Heart size normal. No focal infiltrate. No pleural effusion or pneumothorax. Degenerative change thoracic spine. No acute bony abnormality. IMPRESSION: No acute cardiopulmonary disease. Electronically Signed   By: Marcello Moores  Register   On: 12/14/2020 12:52   CT ANGIO CHEST AORTA W/CM & OR WO/CM  Result Date: 12/14/2020 CLINICAL DATA:  Gradually worsening intermittent central chest pressure sensations for the past 3 weeks. Clinical suspicion for a thoracic aortic aneurysm. EXAM: CT ANGIOGRAPHY CHEST WITH CONTRAST TECHNIQUE: Multidetector CT imaging of the chest was performed using the standard protocol during bolus administration of intravenous contrast. Multiplanar CT image reconstructions and MIPs were obtained to evaluate the vascular anatomy. CONTRAST:  3mL OMNIPAQUE IOHEXOL 350 MG/ML SOLN COMPARISON:  Chest radiographs obtained earlier today. FINDINGS: Cardiovascular: Normally opacified thoracic aorta pulmonary arteries with no aortic aneurysm, dissection or pulmonary arterial filling defects seen. Normal sized heart. Mediastinum/Nodes: No enlarged mediastinal, hilar, or axillary lymph nodes. Thyroid gland, trachea, and esophagus demonstrate no significant findings. Lungs/Pleura: Lungs are clear. No pleural effusion or pneumothorax. Upper Abdomen: 2.8 cm rounded low density mass with peripheral contrast puddling in the posterior aspect of the right lobe of the liver. Musculoskeletal: Thoracic spine degenerative changes. Review of the MIP images confirms the above findings. IMPRESSION: 1. No pulmonary emboli or acute abnormality. 2. 2.8  cm right lobe liver hemangioma. Electronically Signed   By: Claudie Revering M.D.   On: 12/14/2020 14:17    Procedures Procedures   Medications Ordered in ED Medications  iohexol (OMNIPAQUE) 350 MG/ML injection 75 mL (75 mLs Intravenous Contrast Given 12/14/20 1345)    ED Course  I have reviewed the triage vital signs and the nursing notes.  Pertinent labs & imaging results that were available during my care of the patient were reviewed by me and considered in my medical decision making (see chart for details).  Clinical Course as of 12/14/20 1538  Thu Dec 14, 2020  1445 CT scan without acute findings [JK]  1445 Laboratory tests are normal [JK]    Clinical Course User Index [JK] Dorie Rank, MD   MDM Rules/Calculators/A&P HEAR Score: 3                        Patient presented ED for evaluation of chest pain.  Patient described a pressure sensation in her chest.  Symptoms were concerning for the possibility ACS as well as aortic dissection.  CT scan was performed and it does not show any acute abnormalities.  Initial troponin is normal.  EKG is reassuring.  Patient has low risk heart score.  If second troponin is normal may be related to GERD.  Outpatient follow-up with PCP.  Consider outpatient stress testing  Final Clinical Impression(s) / ED Diagnoses Final diagnoses:  Chest pain, unspecified type    Rx / DC Orders ED Discharge Orders    None       Dorie Rank, MD 12/14/20 1540

## 2020-12-14 NOTE — Discharge Instructions (Signed)
Your work-up today was overall reassuring with a negative set of heart enzymes and reassuring CT scan to rule out blood clot or aortic problem.  Given your improved symptoms, the previous team felt you were safe for discharge home if your heart enzymes returned negative which they did.  Please take the new medicine to add to your GI regimen to see if that helps with the discomfort in case there is a reflux contributing factor to your pain.  Please rest and stay hydrated.  Please follow-up with both a PCP as well as a cardiologist for outpatient work-up.  If any symptoms change or worsen acutely, please return to the nearest emergency department.

## 2020-12-14 NOTE — ED Provider Notes (Signed)
4:03 PM Care assumed from Dr. Tomi Bamberger.  At time of transfer of care, patient is waiting for results of delta troponin.  Plan of care was discharged home with plans to follow-up with cardiology for possible outpatient stress test as well as, given her history of GERD, add Carafate to her PPI to help if that is the etiology of her discomfort today.  4:33 PM Delta troponin just came back negative.  Patient reports chest pain is improved.  I have discussed the work-up results and plan of care as outlined.  She agrees with adding Carafate and calling cardiology to follow-up for outpatient stress test.  She understands return precautions and follow-up instructions.  She no other questions or concerns and was discharged in good condition.    Clinical Impression: 1. Chest pain, unspecified type     Disposition: Discharge  Condition: Good  I have discussed the results, Dx and Tx plan with the pt(& family if present). He/she/they expressed understanding and agree(s) with the plan. Discharge instructions discussed at great length. Strict return precautions discussed and pt &/or family have verbalized understanding of the instructions. No further questions at time of discharge.    New Prescriptions   SUCRALFATE (CARAFATE) 1 G TABLET    Take 1 tablet (1 g total) by mouth 4 (four) times daily -  with meals and at bedtime.    Follow Up: [place], [place] [address] [phone]     [place] [address] [facility] [place] [address] [phone]       Oma Alpert, Gwenyth Allegra, MD 12/14/20 [phone]

## 2020-12-14 NOTE — ED Notes (Signed)
Patient ambulated to the restroom without difficulty.

## 2020-12-14 NOTE — ED Notes (Signed)
ED Provider at bedside. 

## 2020-12-14 NOTE — ED Triage Notes (Signed)
She tells  That, for about three weeks, she has experienced gradually increasing (in frequency, duration and intensity) central area chest "pressure". Her skin is normal, warm and dry and she is breathing normally.

## 2021-09-15 IMAGING — DX DG CHEST 2V
2 series · 2 of 2 positions shown · non-contrast
Comparison: 03/22/2019.

CLINICAL DATA: Chest pain.

EXAM:
CHEST - 2 VIEW

[chest pa]
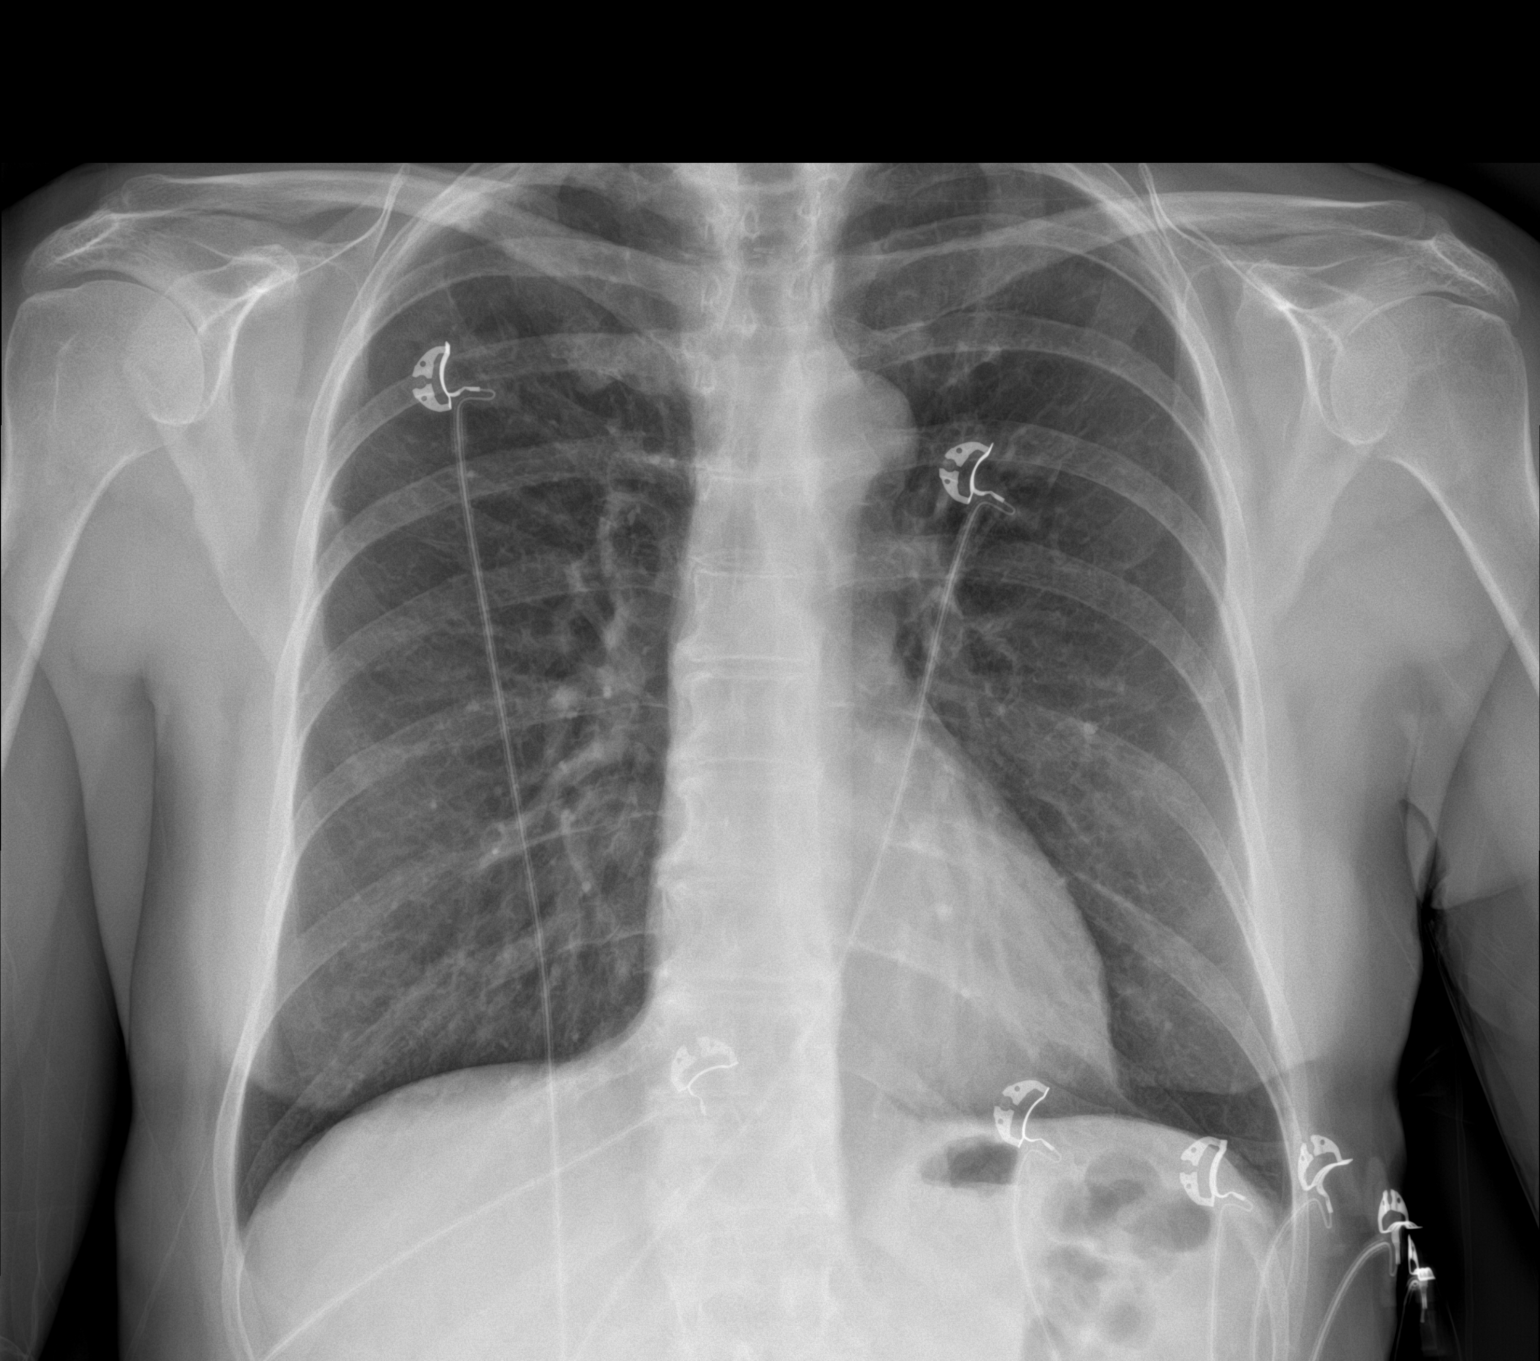

[chest lat]
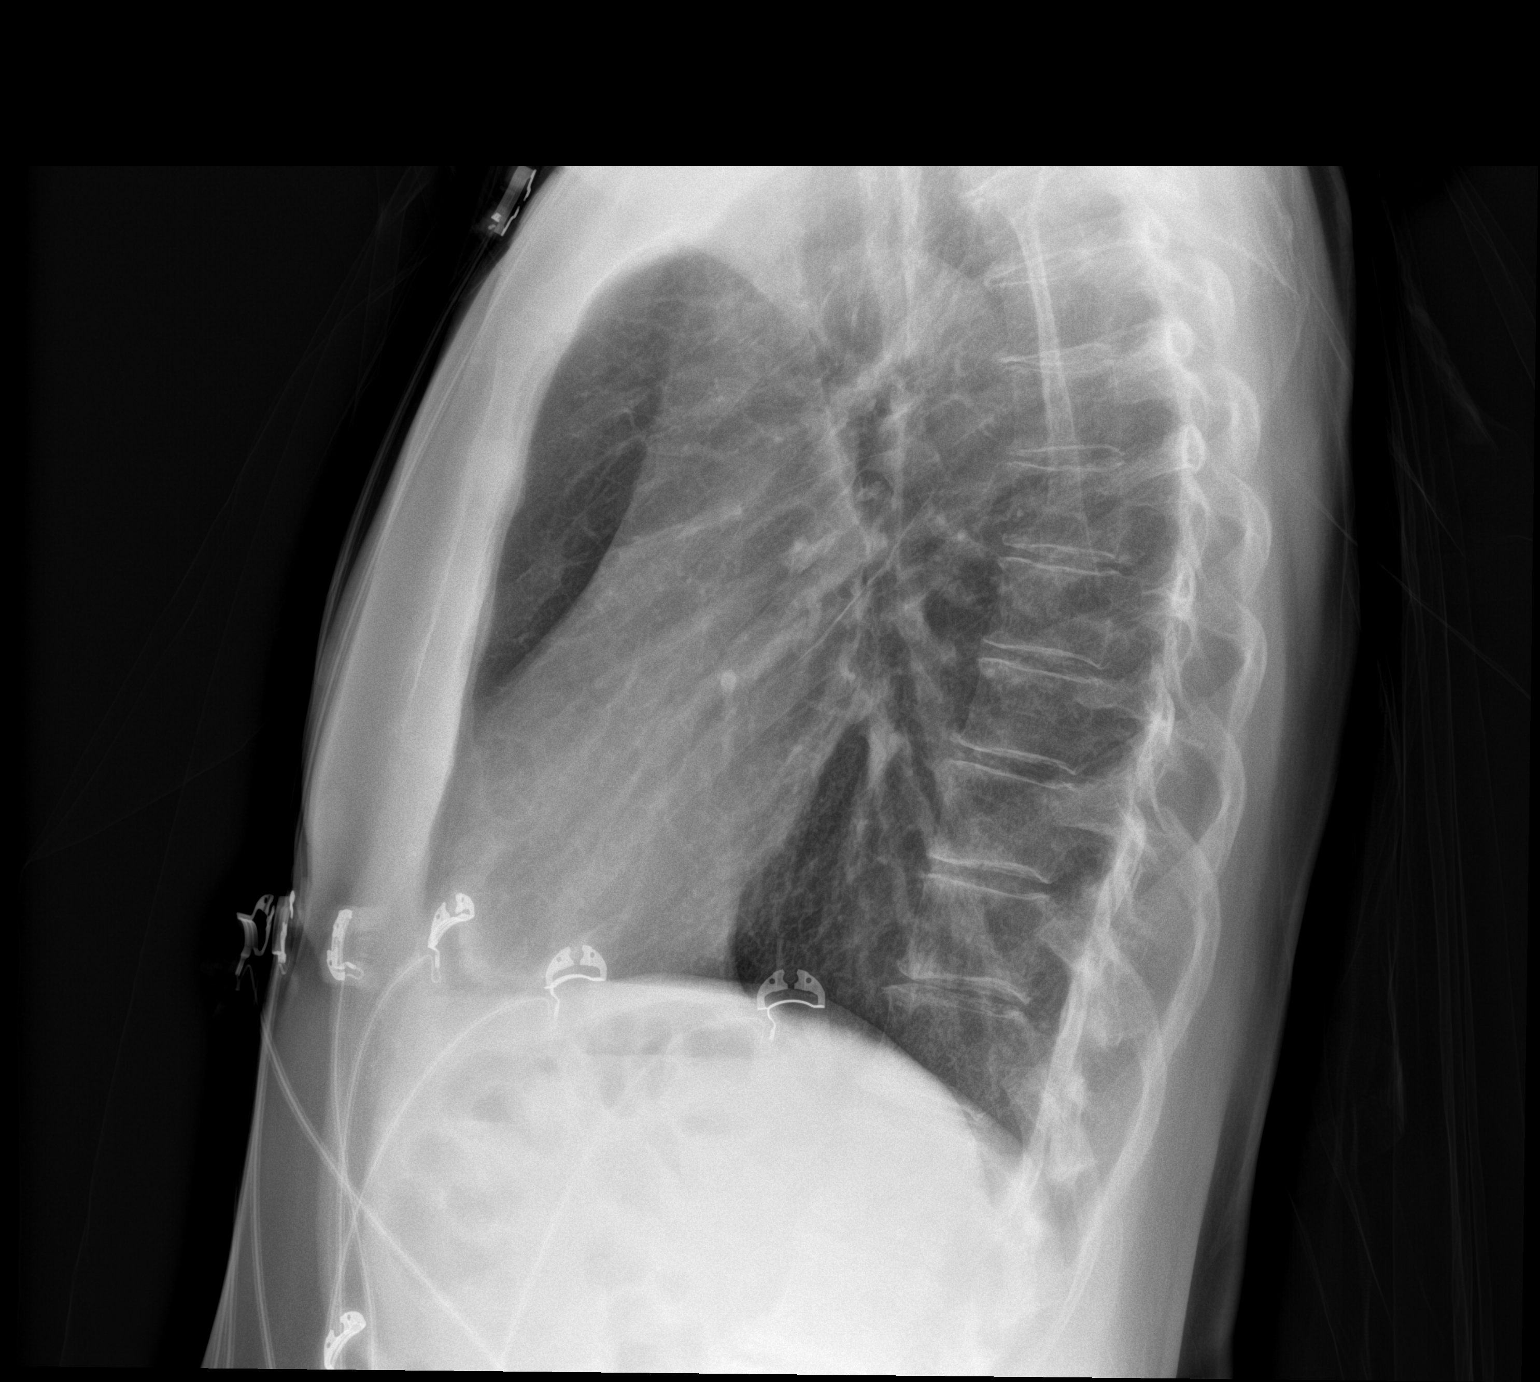

[2 of 2 positions shown; findings below may reference images not displayed]

FINDINGS: Mediastinum and hilar structures normal. Heart size normal. No focal
infiltrate. No pleural effusion or pneumothorax. Degenerative change
thoracic spine. No acute bony abnormality.
IMPRESSION: No acute cardiopulmonary disease.

## 2021-09-15 IMAGING — CT CT ANGIO CHEST
3 of 6 series · 19 of 46 positions shown · IV contrast (APPLIED)
Comparison: Chest radiographs obtained earlier today.

CLINICAL DATA: Gradually worsening intermittent central chest
pressure sensations for the past 3 weeks. Clinical suspicion for a
thoracic aortic aneurysm.

EXAM:
CT ANGIOGRAPHY CHEST WITH CONTRAST
TECHNIQUE: Multidetector CT imaging of the chest was performed using the
standard protocol during bolus administration of intravenous
contrast. Multiplanar CT image reconstructions and MIPs were
obtained to evaluate the vascular anatomy.
CONTRAST:  75mL OMNIPAQUE IOHEXOL 350 MG/ML SOLN

[Series 5: arterial · axial · arterial · 0.70mm/px · z∈[+1292,+1568]mm · 14 of 160 slices shown]
[im 11/160  lung]
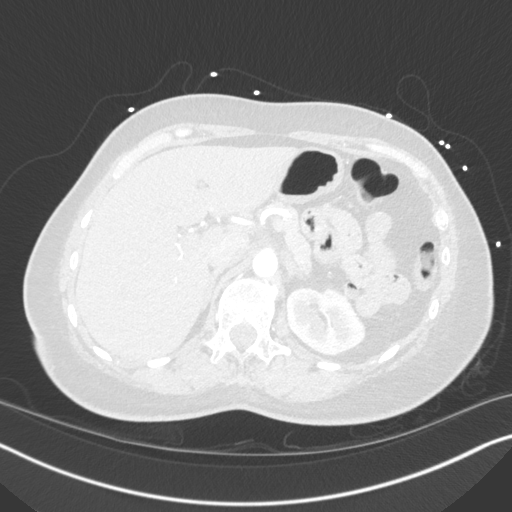
[im 22/160  soft-tissue]
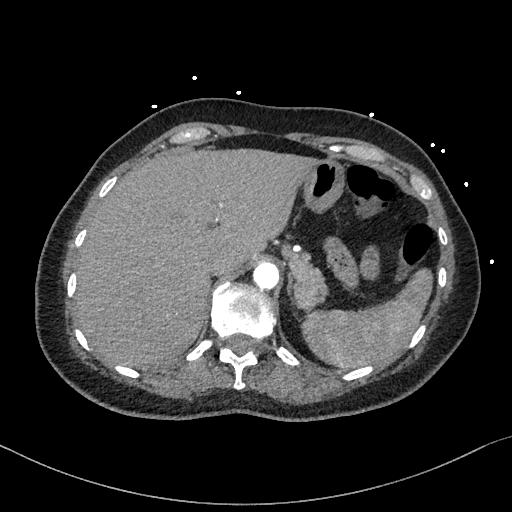
[im 32/160  lung]
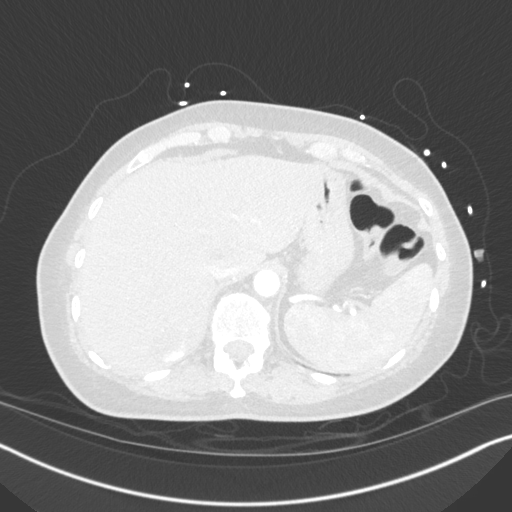
[im 43/160  soft-tissue]
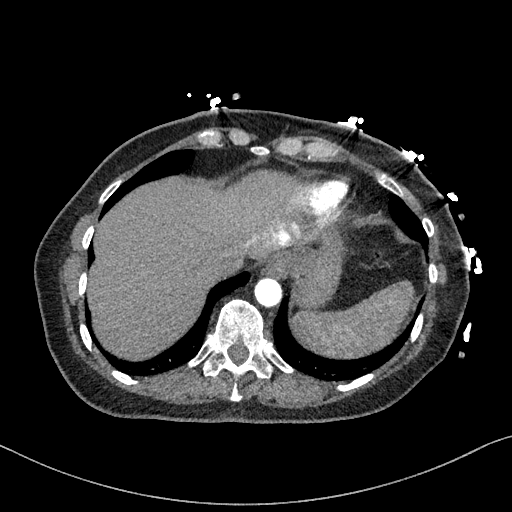
[im 54/160  lung]
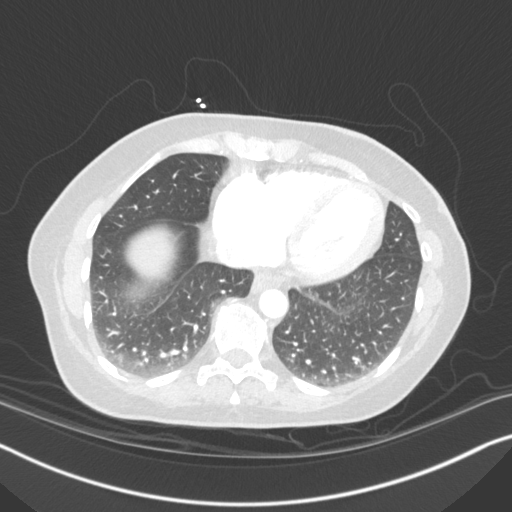
[im 64/160  soft-tissue]
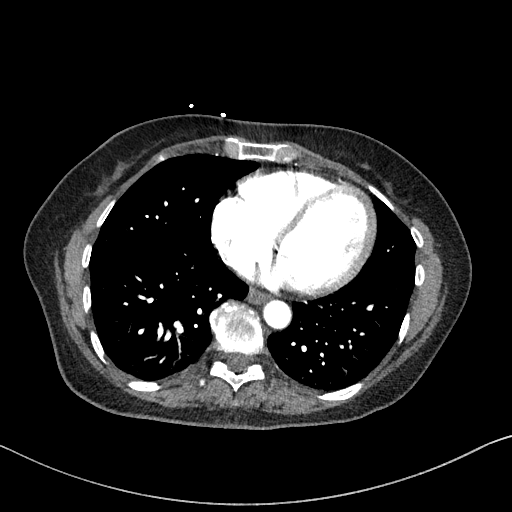
[im 75/160  lung]
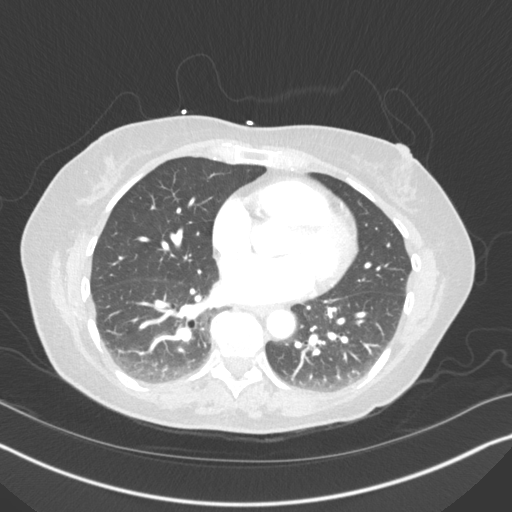
[im 85/160  soft-tissue]
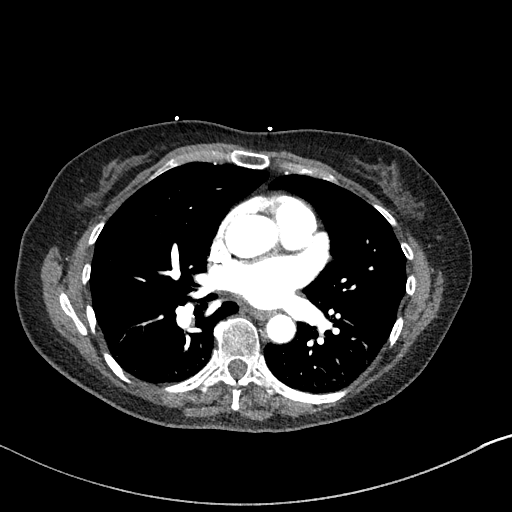
[im 96/160  lung]
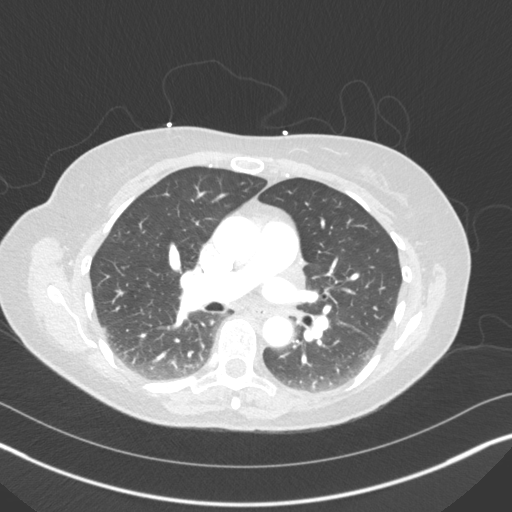
[im 107/160  soft-tissue]
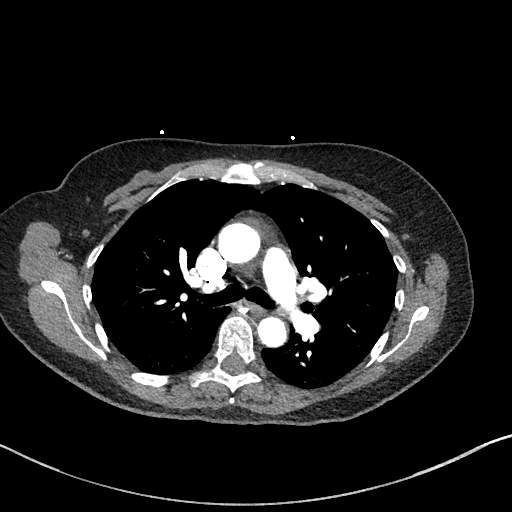
[im 117/160  lung]
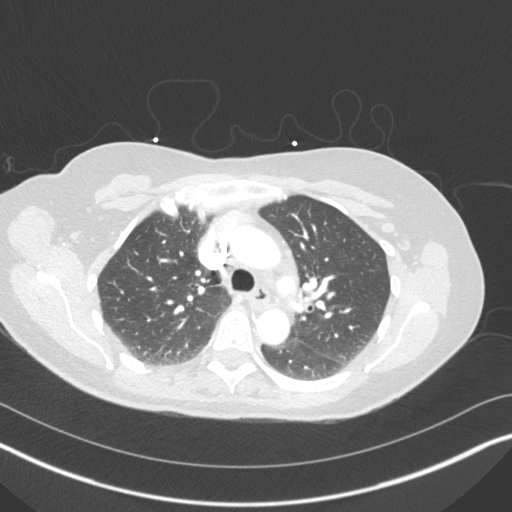
[im 128/160  soft-tissue]
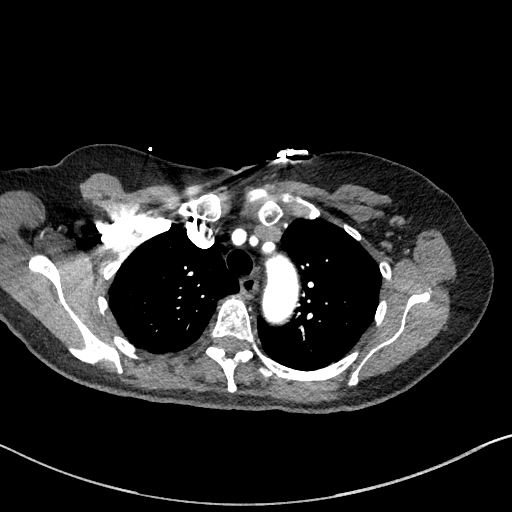
[im 138/160  lung]
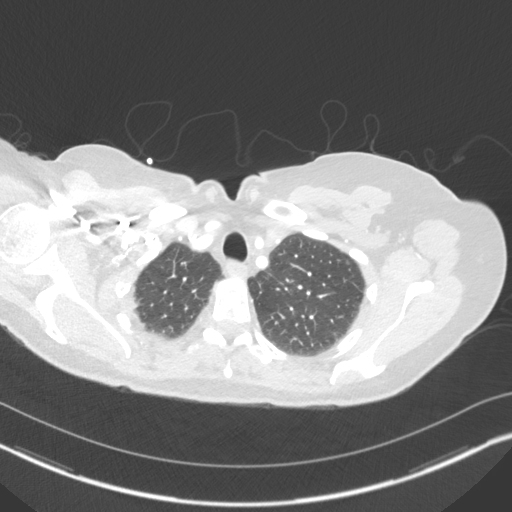
[im 149/160  soft-tissue]
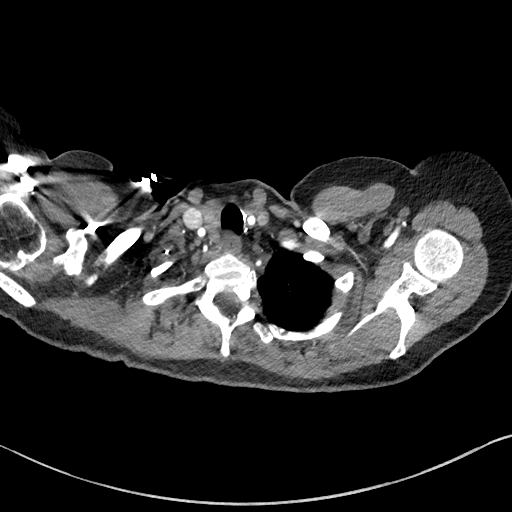

[Series 7: lung · axial · 0.70mm/px · z∈[+1292,+1334]mm · 2 of 160 slices shown]
[im 11/160  soft-tissue]
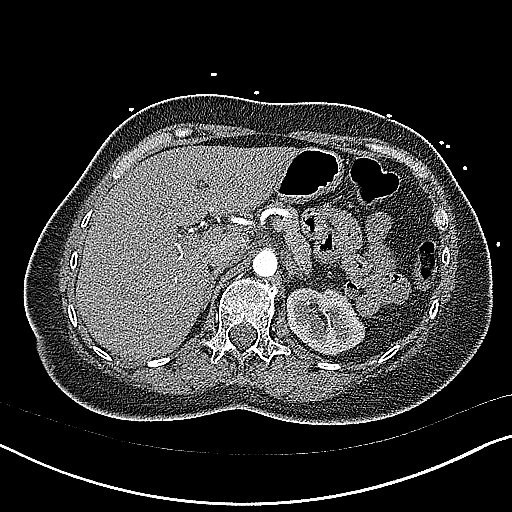
[im 32/160  soft-tissue]
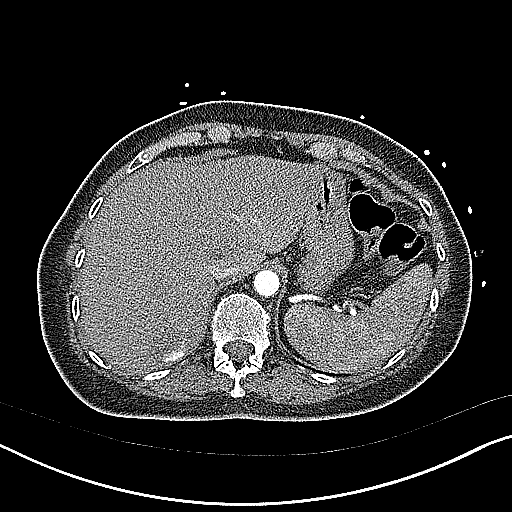

[Series 8: cor soft · coronal · 0.65mm/px · 3 of 128 slices shown]
[im 32/128  soft-tissue]
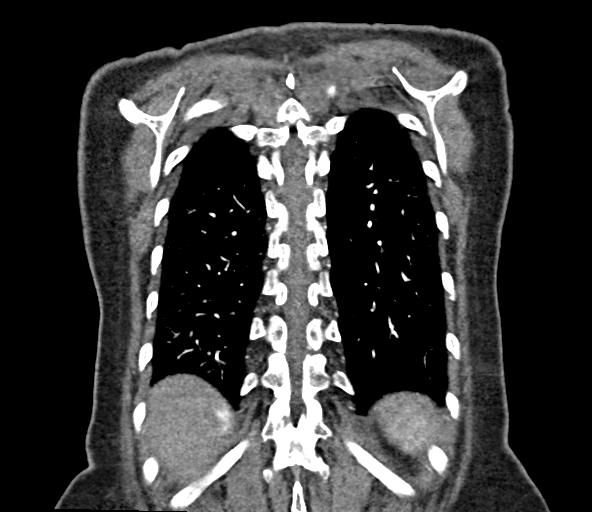
[im 64/128  soft-tissue]
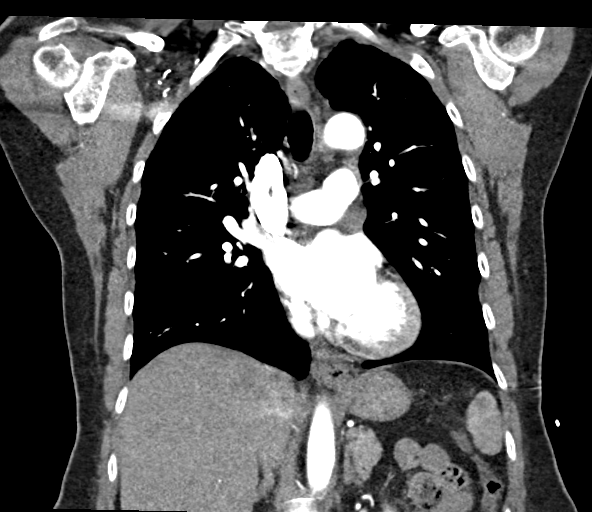
[im 96/128  soft-tissue]
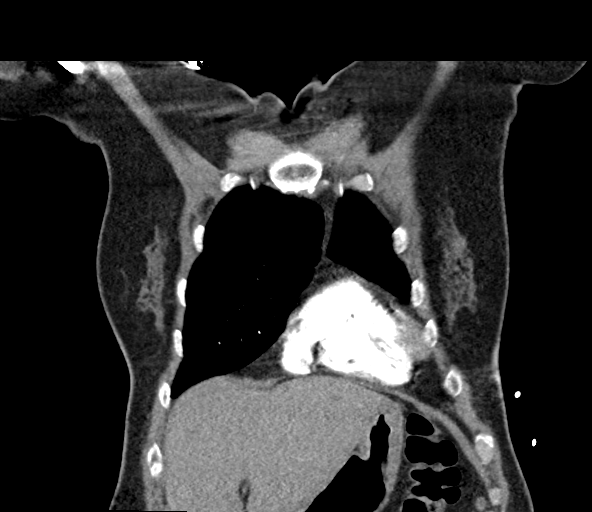

[19 of 46 positions shown; findings below may reference images not displayed]

FINDINGS: Cardiovascular: Normally opacified thoracic aorta pulmonary arteries
with no aortic aneurysm, dissection or pulmonary arterial filling
defects seen. Normal sized heart.

Mediastinum/Nodes: No enlarged mediastinal, hilar, or axillary lymph
nodes. Thyroid gland, trachea, and esophagus demonstrate no
significant findings.

Lungs/Pleura: Lungs are clear. No pleural effusion or pneumothorax.

Upper Abdomen: 2.8 cm rounded low density mass with peripheral
contrast puddling in the posterior aspect of the right lobe of the
liver.

Musculoskeletal: Thoracic spine degenerative changes.

Review of the MIP images confirms the above findings.
IMPRESSION: 1. No pulmonary emboli or acute abnormality.
2. 2.8 cm right lobe liver hemangioma.

## 2023-01-02 ENCOUNTER — Ambulatory Visit: Payer: BC Managed Care – PPO | Admitting: Podiatry

## 2023-01-07 ENCOUNTER — Ambulatory Visit: Payer: BC Managed Care – PPO | Admitting: Podiatry

## 2023-01-07 ENCOUNTER — Ambulatory Visit (INDEPENDENT_AMBULATORY_CARE_PROVIDER_SITE_OTHER): Payer: BC Managed Care – PPO

## 2023-01-07 DIAGNOSIS — M7662 Achilles tendinitis, left leg: Secondary | ICD-10-CM

## 2023-01-07 DIAGNOSIS — M722 Plantar fascial fibromatosis: Secondary | ICD-10-CM

## 2023-01-07 DIAGNOSIS — M7732 Calcaneal spur, left foot: Secondary | ICD-10-CM

## 2023-01-07 MED ORDER — MELOXICAM 7.5 MG PO TABS: 7.5000 mg | ORAL_TABLET | Freq: Every day | ORAL | 0 refills | Status: DC | PRN

## 2023-01-07 NOTE — Progress Notes (Signed)
Subjective:   Patient ID: [name], female   DOB: 62 y.o.   MRN: [id number]   HPI Chief Complaint  Patient presents with   Foot Pain    Ongoing for about 4 months. Heel pain as gotten worst. Patient is a Runner, broadcasting/film/video and stands all day.    62 year old female presents with above concerns.  She said the symptoms of started up 4 months ago she points more on the back of the heel along the Achilles tendon as well she is majority of tenderness but is doing get some discomfort in the ankle as well which started after the back of the heel.  She typically wears open toed shoes in general because it makes her feet feel better and she is not wearing closed in shoes in general even despite having the issue with the back of her heel.  She does not report any recent injuries.  She tried some over-the-counter inserts which been helping somewhat.  No injuries.  No numbness or tingling.   Review of Systems  All other systems reviewed and are negative.  Past Medical History:  Diagnosis Date   Anemia    DDD (degenerative disc disease), cervical    DDD (degenerative disc disease), lumbar    Diverticulosis    Hypersomnia 02/05/2018   Hypothyroidism    Hypothyroidism    Liver hemangioma 4/09   Uterine fibroid     Past Surgical History:  Procedure Laterality Date   BREAST BIOPSY     benign   fibroid tumor     THYROIDECTOMY, PARTIAL     TONSILLECTOMY AND ADENOIDECTOMY       Current Outpatient Medications:    levothyroxine (SYNTHROID) 100 MCG tablet, Take 100 mcg by mouth daily before breakfast., Disp: , Rfl:    meloxicam (MOBIC) 15 MG tablet, Take 15 mg by mouth daily as needed for pain. , Disp: , Rfl:    pantoprazole (PROTONIX) 40 MG tablet, TAKE 30- 60 MIN BEFORE YOUR FIRST AND LAST MEALS OF THE DAY, Disp: 180 tablet, Rfl: 1   pravastatin (PRAVACHOL) 20 MG tablet, Take 20 mg by mouth daily., Disp: , Rfl:    RABEprazole (ACIPHEX) 20 MG tablet, TAKE 1 TABLET BY MOUTH 30- 60 MIN BEFORE YOUR FIRST  AND LAST MEALS OF THE DAY, Disp: 180 tablet, Rfl: 0   sucralfate (CARAFATE) 1 g tablet, Take 1 tablet (1 g total) by mouth 4 (four) times daily -  with meals and at bedtime., Disp: 120 tablet, Rfl: 0  Allergies  Allergen Reactions   Sulfa Antibiotics Itching and Rash          Objective:  Physical Exam  General: AAO x3, NAD  Dermatological: Skin is warm, dry and supple bilateral.  There are no open sores, no preulcerative lesions, no rash or signs of infection present.  Vascular: Dorsalis Pedis artery and Posterior Tibial artery pedal pulses are 2/4 bilateral with immedate capillary fill time. There is no pain with calf compression, swelling, warmth, erythema.   Neruologic: Grossly intact via light touch bilateral. Negative tinel sign.  Musculoskeletal: Prominence of the posterior of the calcaneus consistent with bone spur localized edema of this area.  She gets discomfort along the insertion Achilles tendon but also the mid substance of the Achilles tendon appears to be intact no deficit noted.  Equinus is present.  No area of pinpoint tenderness to the ankle.  Ankle joint range of motion intact without any restrictions or crepitation.  Gait: Unassisted, Nonantalgic.  Assessment:    62 year old female with Achilles now, heel spur; ankle pain     Plan:  -Treatment options discussed including all alternatives, risks, and complications -Etiology of symptoms were discussed -X-rays were obtained and reviewed with the patient.  3 views of the foot were obtained.  No other acute fracture.  Calcaneal spurring present posteriorly. -Prescribed mobic 7.5 daily prn. Discussed side effects of the medication and directed to stop if any are to occur and call the office.  There is any side effects to stop taking it and use topical medication such as Voltaren gel. -Discussed stretching, icing on regular basis.  Discussed shoes and good arch support. -Night splint dispensed to help stretch  the Achilles tendon. -Heel lift  Return in about 2 months (around 03/09/2023), or if symptoms worsen or fail to improve.  Vivi Barrack DPM

## 2023-01-07 NOTE — Patient Instructions (Addendum)
For instructions on how to put on your Night Splint, please visit [email or web address]  ---  For insert I like powersteps, superfeet, aetrex  ---   Achilles Tendinitis  with Rehab Achilles tendinitis is a disorder of the Achilles tendon. The Achilles tendon connects the large calf muscles (Gastrocnemius and Soleus) to the heel bone (calcaneus). This tendon is sometimes called the heel cord. It is important for pushing-off and standing on your toes and is important for walking, running, or jumping. Tendinitis is often caused by overuse and repetitive microtrauma. SYMPTOMS Pain, tenderness, swelling, warmth, and redness may occur over the Achilles tendon even at rest. Pain with pushing off, or flexing or extending the ankle. Pain that is worsened after or during activity. CAUSES  Overuse sometimes seen with rapid increase in exercise programs or in sports requiring running and jumping. Poor physical conditioning (strength and flexibility or endurance). Running sports, especially training running down hills. Inadequate warm-up before practice or play or failure to stretch before participation. Injury to the tendon. PREVENTION  Warm up and stretch before practice or competition. Allow time for adequate rest and recovery between practices and competition. Keep up conditioning. Keep up ankle and leg flexibility. Improve or keep muscle strength and endurance. Improve cardiovascular fitness. Use proper technique. Use proper equipment (shoes, skates). To help prevent recurrence, taping, protective strapping, or an adhesive bandage may be recommended for several weeks after healing is complete. PROGNOSIS  Recovery may take weeks to several months to heal. Longer recovery is expected if symptoms have been prolonged. Recovery is usually quicker if the inflammation is due to a direct blow as compared with overuse or sudden strain. RELATED COMPLICATIONS  Healing time will be prolonged if  the condition is not correctly treated. The injury must be given plenty of time to heal. Symptoms can reoccur if activity is resumed too soon. Untreated, tendinitis may increase the risk of tendon rupture requiring additional time for recovery and possibly surgery. TREATMENT  The first treatment consists of rest anti-inflammatory medication, and ice to relieve the pain. Stretching and strengthening exercises after resolution of pain will likely help reduce the risk of recurrence. Referral to a physical therapist or athletic trainer for further evaluation and treatment may be helpful. A walking boot or cast may be recommended to rest the Achilles tendon. This can help break the cycle of inflammation and microtrauma. Arch supports (orthotics) may be prescribed or recommended by your caregiver as an adjunct to therapy and rest. Surgery to remove the inflamed tendon lining or degenerated tendon tissue is rarely necessary and has shown less than predictable results. MEDICATION  Nonsteroidal anti-inflammatory medications, such as aspirin and ibuprofen, may be used for pain and inflammation relief. Do not take within 7 days before surgery. Take these as directed by your caregiver. Contact your caregiver immediately if any bleeding, stomach upset, or signs of allergic reaction occur. Other minor pain relievers, such as acetaminophen, may also be used. Pain relievers may be prescribed as necessary by your caregiver. Do not take prescription pain medication for longer than 4 to 7 days. Use only as directed and only as much as you need. Cortisone injections are rarely indicated. Cortisone injections may weaken tendons and predispose to rupture. It is better to give the condition more time to heal than to use them. HEAT AND COLD Cold is used to relieve pain and reduce inflammation for acute and chronic Achilles tendinitis. Cold should be applied for 10 to 15 minutes every 2 to 3 hours  for inflammation and pain and  immediately after any activity that aggravates your symptoms. Use ice packs or an ice massage. Heat may be used before performing stretching and strengthening activities prescribed by your caregiver. Use a heat pack or a warm soak. SEEK MEDICAL CARE IF: Symptoms get worse or do not improve in 2 weeks despite treatment. New, unexplained symptoms develop. Drugs used in treatment may produce side effects.  EXERCISES:  RANGE OF MOTION (ROM) AND STRETCHING EXERCISES - Achilles Tendinitis  These exercises may help you when beginning to rehabilitate your injury. Your symptoms may resolve with or without further involvement from your physician, physical therapist or athletic trainer. While completing these exercises, remember:  Restoring tissue flexibility helps normal motion to return to the joints. This allows healthier, less painful movement and activity. An effective stretch should be held for at least 30 seconds. A stretch should never be painful. You should only feel a gentle lengthening or release in the stretched tissue.  STRETCH  Gastroc, Standing  Place hands on wall. Extend right / left leg, keeping the front knee somewhat bent. Slightly point your toes inward on your back foot. Keeping your right / left heel on the floor and your knee straight, shift your weight toward the wall, not allowing your back to arch. You should feel a gentle stretch in the right / left calf. Hold this position for 10 seconds. Repeat 3 times. Complete this stretch 2 times per day.  STRETCH  Soleus, Standing  Place hands on wall. Extend right / left leg, keeping the other knee somewhat bent. Slightly point your toes inward on your back foot. Keep your right / left heel on the floor, bend your back knee, and slightly shift your weight over the back leg so that you feel a gentle stretch deep in your back calf. Hold this position for 10 seconds. Repeat 3 times. Complete this stretch 2 times per day.  STRETCH   Gastrocsoleus, Standing  Note: This exercise can place a lot of stress on your foot and ankle. Please complete this exercise only if specifically instructed by your caregiver.  Place the ball of your right / left foot on a step, keeping your other foot firmly on the same step. Hold on to the wall or a rail for balance. Slowly lift your other foot, allowing your body weight to press your heel down over the edge of the step. You should feel a stretch in your right / left calf. Hold this position for 10 seconds. Repeat this exercise with a slight bend in your knee. Repeat 3 times. Complete this stretch 2 times per day.   STRENGTHENING EXERCISES - Achilles Tendinitis These exercises may help you when beginning to rehabilitate your injury. They may resolve your symptoms with or without further involvement from your physician, physical therapist or athletic trainer. While completing these exercises, remember:  Muscles can gain both the endurance and the strength needed for everyday activities through controlled exercises. Complete these exercises as instructed by your physician, physical therapist or athletic trainer. Progress the resistance and repetitions only as guided. You may experience muscle soreness or fatigue, but the pain or discomfort you are trying to eliminate should never worsen during these exercises. If this pain does worsen, stop and make certain you are following the directions exactly. If the pain is still present after adjustments, discontinue the exercise until you can discuss the trouble with your clinician.  STRENGTH - Plantar-flexors  Sit with your right / left  leg extended. Holding onto both ends of a rubber exercise band/tubing, loop it around the ball of your foot. Keep a slight tension in the band. Slowly push your toes away from you, pointing them downward. Hold this position for 10 seconds. Return slowly, controlling the tension in the band/tubing. Repeat 3 times. Complete  this exercise 2 times per day.   STRENGTH - Plantar-flexors  Stand with your feet shoulder width apart. Steady yourself with a wall or table using as little support as needed. Keeping your weight evenly spread over the width of your feet, rise up on your toes.* Hold this position for 10 seconds. Repeat 3 times. Complete this exercise 2 times per day.  *If this is too easy, shift your weight toward your right / left leg until you feel challenged. Ultimately, you may be asked to do this exercise with your right / left foot only.  STRENGTH  Plantar-flexors, Eccentric  Note: This exercise can place a lot of stress on your foot and ankle. Please complete this exercise only if specifically instructed by your caregiver.  Place the balls of your feet on a step. With your hands, use only enough support from a wall or rail to keep your balance. Keep your knees straight and rise up on your toes. Slowly shift your weight entirely to your right / left toes and pick up your opposite foot. Gently and with controlled movement, lower your weight through your right / left foot so that your heel drops below the level of the step. You will feel a slight stretch in the back of your calf at the end position. Use the healthy leg to help rise up onto the balls of both feet, then lower weight only on the right / left leg again. Build up to 15 repetitions. Then progress to 3 consecutive sets of 15 repetitions.* After completing the above exercise, complete the same exercise with a slight knee bend (about 30 degrees). Again, build up to 15 repetitions. Then progress to 3 consecutive sets of 15 repetitions.* Perform this exercise 2 times per day.  *When you easily complete 3 sets of 15, your physician, physical therapist or athletic trainer may advise you to add resistance by wearing a backpack filled with additional weight.  STRENGTH - Plantar Flexors, Seated  Sit on a chair that allows your feet to rest flat on the  ground. If necessary, sit at the edge of the chair. Keeping your toes firmly on the ground, lift your right / left heel as far as you can without increasing any discomfort in your ankle. Repeat 3 times. Complete this exercise 2 times a day.

## 2023-02-05 ENCOUNTER — Other Ambulatory Visit: Payer: Self-pay | Admitting: Podiatry

## 2023-03-09 ENCOUNTER — Other Ambulatory Visit: Payer: Self-pay | Admitting: Podiatry

## 2023-04-09 ENCOUNTER — Other Ambulatory Visit: Payer: Self-pay | Admitting: Podiatry

## 2023-09-12 ENCOUNTER — Ambulatory Visit: Payer: Self-pay | Admitting: Cardiology

## 2023-11-13 ENCOUNTER — Ambulatory Visit: Payer: Self-pay | Admitting: Cardiology

## 2024-01-30 ENCOUNTER — Telehealth: Payer: Self-pay

## 2024-01-30 NOTE — Telephone Encounter (Signed)
   Name: [name]  DOB: Apr 30, 1961  MRN: [id number]  Primary Cardiologist: None  Chart reviewed as part of pre-operative protocol coverage. Because of [name]'s past medical history and time since last visit, she will require a follow-up in-office visit in order to better assess preoperative cardiovascular risk.  Patient has not been seen by heart care.  Patient will need a new patient appointment to establish care in order to obtain surgical clearance for upcoming colonoscopy.  Pre-op covering staff: - Please schedule appointment and call patient to inform them. If patient already had an upcoming appointment within acceptable timeframe, please add "pre-op clearance" to the appointment notes so provider is aware. - Please contact requesting surgeon's office via preferred method (i.e, phone, fax) to inform them of need for appointment prior to surgery.   Jude Norton, NP  01/30/2024, 2:56 PM

## 2024-01-30 NOTE — Telephone Encounter (Signed)
   Pre-operative Risk Assessment    Patient Name: [name]  DOB: [birth year] MRN: [id number]   Date of last office visit: NONE Date of next office visit: NONE   Request for Surgical Clearance    Procedure:  Colonoscopy  Date of Surgery:  Clearance 02/13/24                                Surgeon: Dr. Lana Pina, MD Surgeon's Group or Practice Name: [facility], [place] Phone number: [phone] Fax number:  [phone]   Type of Clearance Requested:   - Medical    Type of Anesthesia:  Propofol   Additional requests/questions:    Signed, Delroy Fields   01/30/2024, 2:42 PM

## 2024-02-02 NOTE — Telephone Encounter (Signed)
 I left a detailed message for the pt on her # [phone], that she is going to need to be seen in the office as it has almost been 72yrs since she was last seen. I left message that appt after 02/12/24 will mean that she will need a new pt appt. I see her procedure is set for 02/13/24, though the 1st appt I can see is 02/10/24. Cannot promise these appts will still be available when the pt calls back.   I will update the requesting office about needing appt.

## 2024-02-03 NOTE — Telephone Encounter (Signed)
 Pt has appt tomorrow with Dr. Katheryne Pane for preop clearance.

## 2024-02-04 ENCOUNTER — Ambulatory Visit: Payer: Self-pay | Admitting: Cardiovascular Disease

## 2024-02-10 ENCOUNTER — Telehealth: Payer: Self-pay

## 2024-02-10 ENCOUNTER — Ambulatory Visit: Payer: Self-pay | Attending: Cardiology | Admitting: Cardiology

## 2024-02-10 ENCOUNTER — Encounter: Payer: Self-pay | Admitting: Cardiology

## 2024-02-10 ENCOUNTER — Other Ambulatory Visit: Payer: Self-pay

## 2024-02-10 VITALS — BP 128/80 | HR 88 | Resp 16 | Ht 67.0 in | Wt 204.6 lb

## 2024-02-10 DIAGNOSIS — E782 Mixed hyperlipidemia: Secondary | ICD-10-CM | POA: Insufficient documentation

## 2024-02-10 DIAGNOSIS — R072 Precordial pain: Secondary | ICD-10-CM | POA: Insufficient documentation

## 2024-02-10 DIAGNOSIS — Z01818 Encounter for other preprocedural examination: Secondary | ICD-10-CM | POA: Insufficient documentation

## 2024-02-10 DIAGNOSIS — R0609 Other forms of dyspnea: Secondary | ICD-10-CM

## 2024-02-10 DIAGNOSIS — Z79899 Other long term (current) drug therapy: Secondary | ICD-10-CM

## 2024-02-10 MED ORDER — NITROGLYCERIN 0.4 MG SL SUBL: 0.4000 mg | SUBLINGUAL_TABLET | SUBLINGUAL | 3 refills | Status: AC | PRN

## 2024-02-10 MED ORDER — METOPROLOL TARTRATE 100 MG PO TABS: 100.0000 mg | ORAL_TABLET | Freq: Once | ORAL | 0 refills | Status: DC

## 2024-02-10 MED ORDER — ASPIRIN 81 MG PO TBEC: 81.0000 mg | DELAYED_RELEASE_TABLET | Freq: Every day | ORAL | Status: DC

## 2024-02-10 MED ORDER — ROSUVASTATIN CALCIUM 10 MG PO TABS: 10.0000 mg | ORAL_TABLET | Freq: Every day | ORAL | 3 refills | Status: AC

## 2024-02-10 NOTE — Progress Notes (Signed)
 Cardiology Office Note:  .   Date:  02/10/2024  ID:  [name], DOB 24-Oct-1960, MRN [id number] PCP: Lonzie Robins, MD  [facility] Providers Cardiologist:  Fransico Ivy, MD PCP: Lonzie Robins, MD  Chief Complaint  Patient presents with   Pre-op Exam    Colonoscopy Dr. Tova Fresh February 13, 2024   New Patient (Initial Visit)     [name] is a 63 y.o. female with hyperlipidemia, chest pain, exertional dyspnea  Discussed the use of AI scribe software for clinical note transcription with the patient, who gave verbal consent to proceed.  History of Present Illness [name] is a 63 year old female who presents with chest pain and shortness of breath. 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  She experiences chest pain described as pressure across her chest, occurring at rest and with activity, lasting up to 20 minutes. Aspirin sometimes alleviates the pain. These episodes occur approximately twice a week.  Shortness of breath occurs particularly when climbing stairs or after getting up at night. No leg swelling, lightheadedness, or syncope.  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 She is on pravastatin for cholesterol management but has experienced adverse reactions to other statins, including muscle pain and skin reactions. She has also used Repatha in the past.  Family history includes heart disease, with atrial fibrillation in her father and sister, a heart condition related to the aorta in her mother, and heart attacks in both grandmothers. She does not smoke or drink.      Vitals:   02/10/24 0956  BP: 128/80  Pulse: 88  Resp: 16  SpO2: 96%      Review of Systems  Cardiovascular:  Positive for chest pain and dyspnea on exertion. Negative for leg swelling, palpitations and syncope.        Studies Reviewed: Aaron Aas        EKG 02/10/2024: Normal sinus rhythm Normal ECG When  compared with ECG of 14-Dec-2020 12:10, PAC absent   Independently interpreted 08/2023: Chol 164, TG 85, HDL 41, LDL 106 Hb 14 Cr 1.0 TSH 1.3   Physical Exam Vitals and nursing note reviewed.  Constitutional:      General: She is not in acute distress. Neck:     Vascular: No JVD.   Cardiovascular:     Rate and Rhythm: Normal rate and regular rhythm.     Heart sounds: Normal heart sounds. No murmur heard. Pulmonary:     Effort: Pulmonary effort is normal.     Breath sounds: Normal breath sounds. No wheezing or rales.   Musculoskeletal:     Right lower leg: No edema.     Left lower leg: No edema.      VISIT DIAGNOSES:   ICD-10-CM   1. Precordial pain  R07.2 CT CORONARY MORPH W/CTA COR W/SCORE W/CA W/CM &/OR WO/CM    metoprolol tartrate (LOPRESSOR) 100 MG tablet    2. DOE (dyspnea on exertion)  R06.09 ECHOCARDIOGRAM COMPLETE    3. Preoperative examination  Z01.818 EKG 12-Lead    4. Mixed hyperlipidemia  E78.2        [name] is a 63 y.o. female with hyperlipidemia, chest pain, exertional dyspnea  Assessment & Plan Chest pain: Intermittent chest pain at rest and with activity. Differential includes cardiac versus non-cardiac causes. Normal EKG, further testing needed due to family history and symptom nature. Since her colonoscopy is  for screening purposes and not due to any symptoms, she is agreeable to rescheduling colonoscopy to about a month later pending the following tests. - Order coronary CT angiogram for arterial blockages and calcium score. - Order echocardiogram to evaluate cardiac function. - Prescribe nitroglycerin as needed for unresolved chest pain. - Advise taking baby aspirin until further evaluation. - Administer metoprolol before CT scan for optimal imaging.  Dyspnea on exertion:  Shortness of breath with exertion and nocturnally. Potential cardiac etiology, requires further testing. - Evaluate with coronary CT angiogram and  echocardiogram as part of chest pain workup.  Mixed hyperlipidemia, statin intolerance:  Slightly elevated cholesterol. Previous statin therapy not tolerated. Discussed alternative therapies if statins not tolerated. Insurance mandates statin trial. - Prescribe rosuvastatin 10 mg and monitor for tolerance. - Consider inclisiran if statin intolerance persists and insurance approves.  Follow-up with APP in 4 weeks.  If coronary CT angiogram reassuring, aspirin could be discontinued at that time.       Meds ordered this encounter  Medications   rosuvastatin (CRESTOR) 10 MG tablet    Sig: Take 1 tablet (10 mg total) by mouth daily.    Dispense:  90 tablet    Refill:  3   aspirin EC 81 MG tablet    Sig: Take 1 tablet (81 mg total) by mouth daily. Swallow whole.   nitroGLYCERIN (NITROSTAT) 0.4 MG SL tablet    Sig: Place 1 tablet (0.4 mg total) under the tongue every 5 (five) minutes as needed for chest pain.    Dispense:  25 tablet    Refill:  3   metoprolol tartrate (LOPRESSOR) 100 MG tablet    Sig: Take 1 tablet (100 mg total) by mouth once for 1 dose. Take 90-120 minutes prior to scan. Hold for SBP less than 110.    Dispense:  1 tablet    Refill:  0     F/u in 4 weeks  Signed, Cody Das, MD

## 2024-02-10 NOTE — Patient Instructions (Addendum)
 Medication Instructions:  STOP Pravastatin  START Crestor   START Sublingual Nitroglycerin AS NEEDED FOR CHEST PAIN  START ASPIRIN 81 mg   ON DAY OF CORONARY CTA: Start metoprolol tartrate 100 mg: Take 1 tablet (100 mg total) by mouth once for 1 dose. Take 90-120 minutes prior to scan. Hold for SBP less than 110.,   *If you need a refill on your cardiac medications before your next appointment, please call your pharmacy*  Lab Work: BMP   If you have labs (blood work) drawn today and your tests are completely normal, you will receive your results only by: MyChart Message (if you have MyChart) OR A paper copy in the mail If you have any lab test that is abnormal or we need to change your treatment, we will call you to review the results.  Testing/Procedures: Coronary CTA  Your physician has requested that you have cardiac CT. Cardiac computed tomography (CT) is a painless test that uses an x-ray machine to take clear, detailed pictures of your heart. For further information please visit [email or web address] Please follow instruction sheet as given.    ECHO  Your physician has requested that you have an echocardiogram. Echocardiography is a painless test that uses sound waves to create images of your heart. It provides your doctor with information about the size and shape of your heart and how well your heart's chambers and valves are working. This procedure takes approximately one hour. There are no restrictions for this procedure. Please do NOT wear cologne, perfume, aftershave, or lotions (deodorant is allowed). Please arrive 15 minutes prior to your appointment time.  Please note: We ask at that you not bring children with you during ultrasound (echo/ vascular) testing. Due to room size and safety concerns, children are not allowed in the ultrasound rooms during exams. Our front office staff cannot provide observation of children in our lobby area while testing is being conducted.  An adult accompanying a patient to their appointment will only be allowed in the ultrasound room at the discretion of the ultrasound technician under special circumstances. We apologize for any inconvenience.   Follow-Up: At [facility], you and your health needs are our priority.  As part of our continuing mission to provide you with exceptional heart care, our providers are all part of one team.  This team includes your primary Cardiologist (physician) and Advanced Practice Providers or APPs (Physician Assistants and Nurse Practitioners) who all work together to provide you with the care you need, when you need it.  Your next appointment:   6 week(s)  Provider:   One of our Advanced Practice Providers (APPs): Melita Springer, PA-C  Friddie Jetty, NP Evaline Hill, NP  Theotis Flake, PA-C Lawana Pray, NP  Willis Harter, PA-C Lovette Rud, PA-C  [place], [place] Charles Connor, NP  Marlana Silvan, NP Marcie Sever, PA-C  Laquita Plant, PA-C    Dayna Dunn, PA-C  Marlyse Single, PA-C Palmer Bobo, NP Katlyn West, NP Callie Goodrich, PA-C  Evan Williams, PA-C Sheng Haley, PA-C  Xika Zhao, NP Liane Redman, PA-C      Your cardiac CT will be scheduled at one of the below locations:   [facility] [address] [place], [place] [address] [phone]  If scheduled at [facility], please arrive at the [facility]'s Entrance (Entrance C2) of [facility] [place] 30 minutes prior to test start time. You can use the FREE valet parking offered at entrance C (  encouraged to control the heart rate for the test)  Proceed to the [facility] Radiology Department (first floor) to check-in and test prep.  All radiology patients and guests should use entrance C2 at [facility], accessed from [facility], even though the hospital's physical address listed is [address]      Please follow these instructions  carefully (unless otherwise directed):  An IV will be required for this test and Nitroglycerin will be given.  Hold all erectile dysfunction medications at least 3 days (72 hrs) prior to test. (Ie viagra, cialis, sildenafil, tadalafil, etc)   On the Night Before the Test: Be sure to Drink plenty of water. Do not consume any caffeinated/decaffeinated beverages or chocolate 12 hours prior to your test. Do not take any antihistamines 12 hours prior to your test.  On the Day of the Test: Drink plenty of water until 1 hour prior to the test. Do not eat any food 1 hour prior to test. You may take your regular medications prior to the test.  Take metoprolol (Lopressor) two hours prior to test. If you take Furosemide/Hydrochlorothiazide/Spironolactone, please HOLD on the morning of the test. FEMALES- please wear underwire-free bra if available, avoid dresses & tight clothing      After the Test: Drink plenty of water. After receiving IV contrast, you may experience a mild flushed feeling. This is normal. On occasion, you may experience a mild rash up to 24 hours after the test. This is not dangerous. If this occurs, you can take Benadryl 25 mg and increase your fluid intake. If you experience trouble breathing, this can be serious. If it is severe call 911 IMMEDIATELY. If it is mild, please call our office. If you take any of these medications: Glipizide/Metformin, Avandament, Glucavance, please do not take 48 hours after completing test unless otherwise instructed.  We will call to schedule your test 2-4 weeks out understanding that some insurance companies will need an authorization prior to the service being performed.   For more information and frequently asked questions, please visit our website : [email or web address]  For non-scheduling related questions, please contact the cardiac imaging nurse navigator should you have any questions/concerns: Cardiac Imaging Nurse  Navigators Direct Office Dial: [phone]   For scheduling needs, including cancellations and rescheduling, please call [place], [phone].

## 2024-02-10 NOTE — Telephone Encounter (Signed)
 [facility] called lab was set for [facility].  Lab reordered

## 2024-02-11 ENCOUNTER — Ambulatory Visit: Payer: Self-pay

## 2024-02-11 LAB — BASIC METABOLIC PANEL WITH GFR
BUN/Creatinine Ratio: 17 (ref 12–28)
BUN: 15 mg/dL (ref 8–27)
CO2: 26 mmol/L (ref 20–29)
Calcium: 9 mg/dL (ref 8.7–10.3)
Chloride: 102 mmol/L (ref 96–106)
Creatinine, Ser: 0.88 mg/dL (ref 0.57–1.00)
Glucose: 85 mg/dL (ref 70–99)
Potassium: 4.9 mmol/L (ref 3.5–5.2)
Sodium: 143 mmol/L (ref 134–144)
eGFR: 74 mL/min/{1.73_m2} (ref >59)

## 2024-03-04 ENCOUNTER — Telehealth (HOSPITAL_COMMUNITY): Payer: Self-pay | Admitting: Emergency Medicine

## 2024-03-04 ENCOUNTER — Encounter (HOSPITAL_COMMUNITY): Payer: Self-pay

## 2024-03-04 NOTE — Telephone Encounter (Signed)
 Reaching out to patient to offer assistance regarding upcoming cardiac imaging study; pt verbalizes understanding of appt date/time, parking situation and where to check in, pre-test NPO status and medications ordered, and verified current allergies; name and call back number provided for further questions should they arise Rockwell Alexandria RN Navigator Cardiac Imaging [facility] [phone] office [phone] cell

## 2024-03-05 ENCOUNTER — Ambulatory Visit (HOSPITAL_COMMUNITY)
Admission: RE | Admit: 2024-03-05 | Discharge: 2024-03-05 | Disposition: A | Discharge status: Home / Self Care | Source: Ambulatory Visit | Attending: Cardiology

## 2024-03-05 DIAGNOSIS — R072 Precordial pain: Secondary | ICD-10-CM | POA: Insufficient documentation

## 2024-03-05 MED ORDER — IOHEXOL 350 MG/ML SOLN
100.0000 mL | Freq: Once | INTRAVENOUS | Status: AC | PRN
  Administered 2024-03-05: 100 mL via INTRAVENOUS

## 2024-03-05 MED ORDER — NITROGLYCERIN 0.4 MG SL SUBL
0.8000 mg | SUBLINGUAL_TABLET | Freq: Once | SUBLINGUAL | Status: AC
  Administered 2024-03-05: 0.8 mg via SUBLINGUAL

## 2024-03-07 ENCOUNTER — Ambulatory Visit: Payer: Self-pay | Admitting: Cardiology

## 2024-03-07 NOTE — Progress Notes (Signed)
 Normal coronaries with no coronary artery disease. Can stop Aspirin . Continue rosuvastatin  if tolerated, if not then could try Zetia through PCP in future.  Thanks MJP

## 2024-03-24 ENCOUNTER — Ambulatory Visit (HOSPITAL_COMMUNITY)
Admission: RE | Admit: 2024-03-24 | Discharge: 2024-03-24 | Disposition: A | Discharge status: Home / Self Care | Source: Ambulatory Visit | Attending: Cardiology | Admitting: Cardiology

## 2024-03-24 DIAGNOSIS — I082 Rheumatic disorders of both aortic and tricuspid valves: Secondary | ICD-10-CM

## 2024-03-24 DIAGNOSIS — R0609 Other forms of dyspnea: Secondary | ICD-10-CM | POA: Insufficient documentation

## 2024-03-24 DIAGNOSIS — I5030 Unspecified diastolic (congestive) heart failure: Secondary | ICD-10-CM

## 2024-03-25 LAB — ECHOCARDIOGRAM COMPLETE
Area-P 1/2: 3.48 cm2
S' Lateral: 2.73 cm

## 2024-03-29 ENCOUNTER — Encounter: Payer: Self-pay | Admitting: Podiatry

## 2024-03-29 ENCOUNTER — Ambulatory Visit (INDEPENDENT_AMBULATORY_CARE_PROVIDER_SITE_OTHER)

## 2024-03-29 ENCOUNTER — Ambulatory Visit: Payer: Self-pay | Admitting: Podiatry

## 2024-03-29 DIAGNOSIS — M722 Plantar fascial fibromatosis: Secondary | ICD-10-CM

## 2024-03-29 DIAGNOSIS — M7752 Other enthesopathy of left foot: Secondary | ICD-10-CM

## 2024-03-29 DIAGNOSIS — M7662 Achilles tendinitis, left leg: Secondary | ICD-10-CM | POA: Diagnosis not present

## 2024-03-29 MED ORDER — METHYLPREDNISOLONE 4 MG PO TBPK
ORAL_TABLET | ORAL | 0 refills | Status: DC
Start: 2024-03-29 — End: 2024-04-05

## 2024-03-29 NOTE — Patient Instructions (Signed)
I have ordered a MRI of the left ankle for [facility]. If you do not hear for them about scheduling within the next 1 week, or you have any questions please give [place] a call at [phone].

## 2024-03-29 NOTE — Progress Notes (Unsigned)
    Subjective:  Patient ID: [name], female    DOB: 04-10-1961,  MRN: [id number]  Chief Complaint  Patient presents with   Foot Pain    Patient is here for left foot pain, chronic same pain as before but now pain and swelling have moved to inner ankle    Discussed the use of AI scribe software for clinical note transcription with the patient, who gave verbal consent to proceed.  History of Present Illness [name] is a 63 year old female who presents with left foot pain and swelling.  She experiences persistent pain and swelling in her left foot, particularly in the heel area, without any recent injuries. Swelling worsened during a vacation two weeks ago. Pain occurs throughout the day and is noticeable at the end of the day after being home for a while.  She uses Motrin  for pain relief and has tried a night splint, which was discontinued due to discomfort. Heel pads are used in some shoes. Significant pain occurred in the right foot after it was accidentally hit by her grandson. No pain in the front of the ankle or the outside part of the foot, but there is pain at the bottom of the heel.  She has no diabetes and no metal in her body or pacemaker, which is relevant for potential imaging studies.      Objective:    Physical Exam General: AAO x3, NAD  Dermatological: Skin is warm, dry and supple bilateral. Nails x 10 are well manicured; remaining integument appears unremarkable at this time. There are no open sores, no preulcerative lesions, no rash or signs of infection present.  Vascular: Dorsalis Pedis artery and Posterior Tibial artery pedal pulses are 2/4 bilateral with immedate capillary fill time. There is no pain with calf compression, swelling, warmth, erythema.   Neruologic: Grossly intact via light touch bilateral.   Musculoskeletal: Cavus foot type. Tenderness along the medial aspect of the ankle and the course of the flexor tendons.  Clinically the tendons  appear to be intact.  There is discomfort on the plantar the calcaneal insertion of the plantar fascia.  There is no area pinpoint tenderness.  There is no pain with compression of calcaneus.  MMT 5/5.  Gait: Unassisted, Nonantalgic.     No images are attached to the encounter.    Results     Assessment:   1. Tendonitis of ankle, left   2. Achilles tendinitis of left lower extremity      Plan:  Patient was evaluated and treated and all questions answered.  Assessment and Plan Assessment & Plan Left foot and ankle tendinitis with calcaneal spur and synovitis Chronic tendinitis with calcaneal spur and synovitis causing pain and inflammation, exacerbated by high arches. No rupture suspected, but further evaluation needed. - X-rays obtained reviewed of the left foot.  No evidence of acute fracture.  Calcaneal spurring is present.  Increased calcaneal inclination angle. - Prescribed Medrol  Dosepak for inflammation. - Ordered MRI of the left ankle to assess for potential tendon tears. - Recommended physical therapy for muscle stretching and rehabilitation, await MRI. - Advised on proper supportive footwear. - Discussed alternative treatments like shockwave therapy   Return for MRI results.   Donnice JONELLE Fees DPM

## 2024-03-30 NOTE — Progress Notes (Deleted)
  Cardiology Office Note   Date:  03/30/2024  ID:  [name], DOB 12-15-1960, MRN [id number] PCP: Janey Santos, MD  [facility] Providers Cardiologist:  Newman JINNY Lawrence, MD    History of Present Illness [name] is a 63 y.o. female who presents with history of chest pain and shortness of breath here for follow-up appointment.  History includes episode of chest pain described as pressure across her chest occurring at rest and with activity lasting up to 20 minutes.  Aspirin  sometimes helps relieve the pain.  The symptoms occur approximately twice a week.  Shortness of breath occurs particular when climbing the stairs or getting up at night.  No leg swelling, lightheadedness, or syncope.  Past medical history includes an extra heartbeat noted previous EKG.  She is on pravastatin for cholesterol management but has experienced some adverse effects which includes muscle pain and skin reactions.  Also used Repatha in the past.  Family history includes heart disease with atrial fibrillation in her father and sister, heart condition related to the aorta and her mother and heart attacks in both grandmothers.  Does not drink or smoke.  Today, she***  ROS: ***  Studies Reviewed      *** Risk Assessment/Calculations {Does this patient have ATRIAL FIBRILLATION?[phone]} No BP recorded.  {Refresh Note OR Click here to enter BP  :1}***       Physical Exam VS:  There were no vitals taken for this visit.       Wt Readings from Last 3 Encounters:  02/10/24 204 lb 9.6 oz (92.8 kg)  04/26/19 201 lb (91.2 kg)  03/22/19 201 lb (91.2 kg)    GEN: Well nourished, well developed in no acute distress NECK: No JVD; No carotid bruits CARDIAC: ***RRR, no murmurs, rubs, gallops RESPIRATORY:  Clear to auscultation without rales, wheezing or rhonchi  ABDOMEN: Soft, non-tender, non-distended EXTREMITIES:  No edema; No deformity   ASSESSMENT AND PLAN  Chest pain  DOE  HLD     {Are you ordering a CV Procedure (e.g. stress test, cath, DCCV, TEE, etc)?   Press F2        :[id number]}  Dispo: ***  Signed, Orren LOISE Fabry, PA-C

## 2024-04-01 ENCOUNTER — Ambulatory Visit
Admission: RE | Admit: 2024-04-01 | Discharge: 2024-04-01 | Disposition: A | Discharge status: Home / Self Care | Source: Ambulatory Visit | Attending: Podiatry | Admitting: Podiatry

## 2024-04-01 DIAGNOSIS — M7662 Achilles tendinitis, left leg: Secondary | ICD-10-CM

## 2024-04-05 ENCOUNTER — Other Ambulatory Visit: Payer: Self-pay | Admitting: *Deleted

## 2024-04-05 ENCOUNTER — Encounter: Payer: Self-pay | Admitting: Physician Assistant

## 2024-04-05 ENCOUNTER — Ambulatory Visit: Attending: Physician Assistant | Admitting: Physician Assistant

## 2024-04-05 VITALS — BP 110/70 | HR 68 | Ht 67.5 in | Wt 204.4 lb

## 2024-04-05 DIAGNOSIS — R072 Precordial pain: Secondary | ICD-10-CM | POA: Diagnosis not present

## 2024-04-05 DIAGNOSIS — R0602 Shortness of breath: Secondary | ICD-10-CM

## 2024-04-05 DIAGNOSIS — E782 Mixed hyperlipidemia: Secondary | ICD-10-CM

## 2024-04-05 DIAGNOSIS — R0683 Snoring: Secondary | ICD-10-CM

## 2024-04-05 MED ORDER — FUROSEMIDE 20 MG PO TABS: ORAL_TABLET | ORAL | 3 refills | Status: AC

## 2024-04-05 MED ORDER — EZETIMIBE 10 MG PO TABS: 10.0000 mg | ORAL_TABLET | Freq: Every day | ORAL | 3 refills | Status: AC

## 2024-04-05 NOTE — Assessment & Plan Note (Signed)
 Recent CCTA TA with calcium  score 0 and no CAD.  ASCVD 10-year risk is 3.1%. Previous intolerance to pravastatin, rosuvastatin , and Repatha.  Given low risk profile, it would be reasonable to proceed with conservative management with follow-up.  However, she notes family history of concern.  She could not tolerate rosuvastatin , Repatha or pravastatin.  Discussed potential use of ezetimibe , a non-statin medication, which is generally well-tolerated with rare GI side effects. - Discontinue rosuvastatin . - Start ezetimibe  10 mg daily. - Arrange fasting lipids and LFT if ezetimibe  is tolerated.

## 2024-04-05 NOTE — Progress Notes (Signed)
 OFFICE NOTE:    Date:  04/05/2024  ID:  [name], DOB 05-Jan-1961, MRN [id number] PCP: Janey Santos, MD  [facility] Providers Cardiologist:  Newman JINNY Lawrence, MD        Palpitations Hyperlipidemia Chest pain TTE 03/24/2024: EF 65-70, no RWMA, GR 1 DD, normal RVSF, trivial MR, AV sclerosis CCTA 03/05/2024: CAC score 0, no CAD, ascending aorta normal 3.2 cm       Discussed the use of AI scribe software for clinical note transcription with the patient, who gave verbal consent to proceed. History of Present Illness [name] is a 63 y.o. female who returns for follow-up of chest pain.  She was evaluated by Dr. Lawrence 02/10/2024 for chest pain.  She was placed on aspirin  and given nitroglycerin  as needed chest pain.  Coronary CTA demonstrated calcium  score 0 and no CAD.  Ascending thoracic aorta was normal at 3.2 cm.  Echocardiogram demonstrated normal LV function, mild diastolic dysfunction and no significant valvular heart disease.  She is here with her husband.  She experiences a squeezing sensation in her chest.  She often notes symptoms at night after getting up to go to the bathroom (urinate).  This is accompanied by shortness of breath.  She notes chest discomfort with physical exertion, such as walking or climbing hills, and feels like a heavy weight on her chest. She experiences lightheadedness during these episodes but no palpitations. She feels tired during the day and experiences shortness of breath when lying flat, necessitating the use of multiple pillows to sleep. She experiences swelling in her legs at times.  She notes a history of snoring as well as being diagnosed with mild sleep apnea past.  She is not currently on CPAP.  Her last sleep test was over 4 years ago.  Her husband notes significant snoring as well as witnessed apnea.  She had to stop rosuvastatin  secondary to myalgias.    ROS-See HPI    Studies Reviewed:      Results Labs per  [place] 09/05/23: TC 164, Trig 85, HDL 41, LDL 106    RADIOLOGY Coronary CTA: Negative for coronary artery disease (CAD), calcium  score of 0, normal aortic size, no non-cardiac abnormalities in the chest.  DIAGNOSTIC Echocardiogram: Normal left ventricular systolic function, ejection fraction 60-65%, no valvular abnormalities, mild diastolic dysfunction.       STOP-Bang Score:  4     Physical Exam:  VS:  BP 110/70   Pulse 68   Ht 5' 7.5 (1.715 m)   Wt 204 lb 6.4 oz (92.7 kg)   SpO2 97%   BMI 31.54 kg/m        Wt Readings from Last 3 Encounters:  04/05/24 204 lb 6.4 oz (92.7 kg)  02/10/24 204 lb 9.6 oz (92.8 kg)  04/26/19 201 lb (91.2 kg)    Constitutional:      Appearance: Healthy appearance. Not in distress.  Neck:     Vascular: No JVR. JVD normal.  Pulmonary:     Breath sounds: Normal breath sounds. No wheezing. No rales.  Cardiovascular:     Normal rate. Regular rhythm.     Murmurs: There is no murmur.  Edema:    Peripheral edema absent.  Abdominal:     Palpations: Abdomen is soft.       Assessment and Plan:    Assessment & Plan Precordial chest pain She was recent evaluated for chest pain.  Coronary CTA demonstrated calcium  score of 0 and  no CAD.  Echocardiogram demonstrated normal LV function and no significant valvular heart disease.  Her symptoms include exertional chest discomfort and shortness of breath, orthopnea, and occasional lower extremity edema. Echocardiogram shows mild diastolic dysfunction. Strong suspicion for HFpEF contributing to symptoms. Differential also includes cardiac microvascular dysfunction (CMD).   - Obtain BMET and BNP today. - Initiate furosemide  10 mg three times a week. - Increase dose of furosemide  if BNP elevated - Repeat BMET in two weeks. - If she has no clear response to diuretics, consider med therapy for CMD as well as PET MPI - Plan follow-up in three months. Snoring STOP-Bang Score:  4   She has hx of snoring, witnessed  apnea, daytime hypesomnolence.  - Arrange home sleep study.  Mixed hyperlipidemia Recent CCTA TA with calcium  score 0 and no CAD.  ASCVD 10-year risk is 3.1%. Previous intolerance to pravastatin, rosuvastatin , and Repatha.  Given low risk profile, it would be reasonable to proceed with conservative management with follow-up.  However, she notes family history of concern.  She could not tolerate rosuvastatin , Repatha or pravastatin.  Discussed potential use of ezetimibe , a non-statin medication, which is generally well-tolerated with rare GI side effects. - Discontinue rosuvastatin . - Start ezetimibe  10 mg daily. - Arrange fasting lipids and LFT if ezetimibe  is tolerated.         Dispo:  Return in about 3 months (around 07/06/2024) for Routine Follow Up, w/ Dr. [place], or Glendia Ferrier, PA-C.  Signed, Glendia Ferrier, PA-C

## 2024-04-05 NOTE — Patient Instructions (Addendum)
 Medication Instructions:  Your physician has recommended you make the following change in your medication:   START Lasix  20 mg taking 1/2 tablet 3 times a week  START Zetia  10 mg taking 1 daily  STOP Rosuvastatin   *If you need a refill on your cardiac medications before your next appointment, please call your pharmacy*  Lab Work: TODAY:  BMET & PRO BNP  2 WEEKS:  COME BACK TO [facility] FOR ANOTHER:  BMET  If you have labs (blood work) drawn today and your tests are completely normal, you will receive your results only by: MyChart Message (if you have MyChart) OR A paper copy in the mail If you have any lab test that is abnormal or we need to change your treatment, we will call you to review the results.  Testing/Procedures: Your physician has recommended that you have a sleep study [facility])  This test records several body functions during sleep, including: brain activity, eye movement, oxygen and carbon dioxide blood levels, heart rate and rhythm, breathing rate and rhythm, the flow of air through your mouth and nose, snoring, body muscle movements, and chest and belly movement.  You will receive this in the mail.   Follow-Up: At [facility], you and your health needs are our priority.  As part of our continuing mission to provide you with exceptional heart care, our providers are all part of one team.  This team includes your primary Cardiologist (physician) and Advanced Practice Providers or APPs (Physician Assistants and Nurse Practitioners) who all work together to provide you with the care you need, when you need it.  Your next appointment:   3 month(s)  Provider:   Newman JINNY Lawrence, MD    We recommend signing up for the patient portal called MyChart.  Sign up information is provided on this After Visit Summary.  MyChart is used to connect with patients for Virtual Visits (Telemedicine).  Patients are able to view lab/test results, encounter notes, upcoming  appointments, etc.  Non-urgent messages can be sent to your provider as well.   To learn more about what you can do with MyChart, go to [email or web address].   Other Instructions

## 2024-04-05 NOTE — Assessment & Plan Note (Signed)
 STOP-Bang Score:  4  { Consider Dx Sleep Disordered Breathing or Sleep Apnea  ICD G47.33          :1} She has hx of snoring, witnessed apnea, daytime hypesomnolence.  - Arrange home sleep study.

## 2024-04-06 ENCOUNTER — Ambulatory Visit: Payer: Self-pay | Admitting: Physician Assistant

## 2024-04-06 ENCOUNTER — Ambulatory Visit: Admitting: Physician Assistant

## 2024-04-06 DIAGNOSIS — Z79899 Other long term (current) drug therapy: Secondary | ICD-10-CM

## 2024-04-06 DIAGNOSIS — E782 Mixed hyperlipidemia: Secondary | ICD-10-CM

## 2024-04-06 DIAGNOSIS — R0609 Other forms of dyspnea: Secondary | ICD-10-CM

## 2024-04-06 DIAGNOSIS — R072 Precordial pain: Secondary | ICD-10-CM

## 2024-04-06 LAB — BASIC METABOLIC PANEL WITH GFR
BUN/Creatinine Ratio: 21 (ref 12–28)
BUN: 19 mg/dL (ref 8–27)
CO2: 25 mmol/L (ref 20–29)
Calcium: 8.8 mg/dL (ref 8.7–10.3)
Chloride: 103 mmol/L (ref 96–106)
Creatinine, Ser: 0.91 mg/dL (ref 0.57–1.00)
Glucose: 94 mg/dL (ref 70–99)
Potassium: 4.8 mmol/L (ref 3.5–5.2)
Sodium: 143 mmol/L (ref 134–144)
eGFR: 71 mL/min/1.73 (ref >59)

## 2024-04-06 LAB — PRO B NATRIURETIC PEPTIDE: NT-Pro BNP: 210 pg/mL (ref 0–287)

## 2024-04-13 ENCOUNTER — Ambulatory Visit: Payer: Self-pay | Admitting: Podiatry

## 2024-04-13 ENCOUNTER — Other Ambulatory Visit: Payer: Self-pay | Admitting: Podiatry

## 2024-04-13 DIAGNOSIS — M7752 Other enthesopathy of left foot: Secondary | ICD-10-CM

## 2024-04-13 DIAGNOSIS — M7662 Achilles tendinitis, left leg: Secondary | ICD-10-CM

## 2024-04-23 LAB — BASIC METABOLIC PANEL WITH GFR
BUN/Creatinine Ratio: 20 (ref 12–28)
BUN: 17 mg/dL (ref 8–27)
CO2: 23 mmol/L (ref 20–29)
Calcium: 9 mg/dL (ref 8.7–10.3)
Chloride: 101 mmol/L (ref 96–106)
Creatinine, Ser: 0.84 mg/dL (ref 0.57–1.00)
Glucose: 89 mg/dL (ref 70–99)
Potassium: 4.3 mmol/L (ref 3.5–5.2)
Sodium: 139 mmol/L (ref 134–144)
eGFR: 78 mL/min/1.73 (ref >59)

## 2024-05-14 ENCOUNTER — Ambulatory Visit

## 2024-06-04 ENCOUNTER — Telehealth: Payer: Self-pay

## 2024-06-04 NOTE — Telephone Encounter (Signed)
 Ordering provider: Lelon Hamilton, PA-C Associated diagnoses: Snoring [R06.83]  Patient NOT notified of PIN (1234) on 06/04/2024   Left patient a voicemail to return the call  Phone note routed to covering staff for follow-up.

## 2024-06-07 ENCOUNTER — Telehealth: Payer: Self-pay

## 2024-06-07 NOTE — Telephone Encounter (Signed)
   Pre-operative Risk Assessment    Patient Name: [name]  DOB: [birth year] MRN: [id number]   Date of last office visit: 04/05/24 Date of next office visit: 07/07/24   Request for Surgical Clearance    Procedure:  Colonoscopy  Date of Surgery:  Clearance TBD                                Surgeon:  Dr. Kristie Socks Group or Practice Name:  [facility], [place] Phone number:  [phone] Fax number:  [phone]   Type of Clearance Requested:   - Medical    Type of Anesthesia:  Propofol   Additional requests/questions:    [name]   06/07/2024, 2:52 PM

## 2024-06-07 NOTE — Telephone Encounter (Signed)
 Pt would like wait until closer to November. Please advise.

## 2024-06-07 NOTE — Telephone Encounter (Signed)
 LVMFCB to ask pt if she'd like to wait until November appt to address preop clearance or if she wants sooner

## 2024-06-07 NOTE — Telephone Encounter (Signed)
   Name: [name]  DOB: 1961-06-26  MRN: [id number]  Primary Cardiologist: Newman JINNY Lawrence, MD  Chart reviewed as part of pre-operative protocol coverage. Because of [name]'s past medical history and time since last visit, she will require a follow-up in-office visit in order to better assess preoperative cardiovascular risk.  Pre-op covering staff: - Please schedule appointment and call patient to inform them. If patient already had an upcoming appointment within acceptable timeframe, please add pre-op clearance to the appointment notes so provider is aware. - Please contact requesting surgeon's office via preferred method (i.e, phone, fax) to inform them of need for appointment prior to surgery.  No medications indicated as needing held.   Orren LOISE Fabry, PA-C  06/07/2024, 3:58 PM

## 2024-06-08 NOTE — Telephone Encounter (Signed)
 Clearance can be addressed at patient's upcoming appointment and note has been made to appt line that preop clearance is needed

## 2024-06-14 NOTE — Telephone Encounter (Addendum)
**Note De-Identified Lukus Binion Obfuscation** I called the pts home phone and her cell phone but got no answer at either so I left a message on both VMs asking the pt to call me back at [phone] and advising her that I have sent her a [facility] message as well.  The pt needs a WatchPAT One-HST device.

## 2024-06-17 NOTE — Telephone Encounter (Signed)
 Patient agreement reviewed and signed on 06/17/2024.  WatchPAT issued to patient on 06/17/2024 by Joshua Dalton Seip, CMA. Patient aware to not open the WatchPAT box until contacted with the activation PIN. Patient profile initialized in CloudPAT on 06/17/2024 by [facility]                                                                                                                                                                                                                                                                                                                                                                                                                                 Device serial number: [id number]  Please list Reason for Call as Advice Only and type WatchPAT issued to patient in the comment box.

## 2024-06-23 NOTE — Telephone Encounter (Signed)
 He was ordered by Glendia Ferrier, PA-C, but I agree with the recommendation.  I would have to defer the ultimate decision to the patient.  Thanks MJP

## 2024-06-23 NOTE — Telephone Encounter (Signed)
 Patients husband came back in today to return her watchPat One sleep test. They are concerned about the cost to read the test. They state they called their insurance company but they still did not get the answers they wanted. They called billing and but still did not get the answers they wanted. They state that they may go to pulmonary and get tested. I have unregistered the device.

## 2024-07-07 ENCOUNTER — Encounter: Payer: Self-pay | Admitting: Cardiology

## 2024-07-07 ENCOUNTER — Ambulatory Visit: Attending: Student in an Organized Health Care Education/Training Program | Admitting: Cardiology

## 2024-07-07 VITALS — BP 105/71 | HR 78 | Ht 67.5 in | Wt 200.0 lb

## 2024-07-07 DIAGNOSIS — E782 Mixed hyperlipidemia: Secondary | ICD-10-CM | POA: Diagnosis not present

## 2024-07-07 DIAGNOSIS — Z01810 Encounter for preprocedural cardiovascular examination: Secondary | ICD-10-CM | POA: Insufficient documentation

## 2024-07-07 NOTE — Progress Notes (Signed)
 Cardiology Office Note:  .   Date:  07/07/2024  ID:  [name], DOB 08/31/1960, MRN [id number] PCP: Janey Santos, MD  [facility] Providers Cardiologist:  Newman Lawrence, MD PCP: Janey Santos, MD  Chief Complaint  Patient presents with   Pre-op Exam   Follow-up    3 months     [name] is a 63 y.o. female with hyperlipidemia  History of Present Illness Patient is doing well, denies any complaints of chest pain or shortness of breath.  He had some myalgias with Zetia .  She has upcoming colonoscopy.      Vitals:   07/07/24 1337  BP: 105/71  Pulse: 78  SpO2: 97%       Review of Systems  Cardiovascular:  Negative for chest pain, dyspnea on exertion, leg swelling, palpitations and syncope.        Studies Reviewed: [name]        EKG 07/07/2024: Normal sinus rhythm Normal ECG When compared with ECG of 10-Feb-2024 09:56, No significant change was found     Echocardiogram 02/2024:  1. Left ventricular ejection fraction, by estimation, is 65 to 70%. Left  ventricular ejection fraction by 3D volume is 70 %. The left ventricle has  normal function. The left ventricle has no regional wall motion  abnormalities. Left ventricular diastolic   parameters are consistent with Grade I diastolic dysfunction (impaired  relaxation). The average left ventricular global longitudinal strain is  -22.3 %. The global longitudinal strain is normal.   2. Right ventricular systolic function is normal. The right ventricular  size is normal.   3. The mitral valve is normal in structure. Trivial mitral valve  regurgitation. No evidence of mitral stenosis.   4. The aortic valve is tricuspid. There is mild calcification of the  aortic valve. Aortic valve regurgitation is not visualized. Aortic valve  sclerosis/calcification is present, without any evidence of aortic  stenosis.   5. The inferior vena cava is normal in size with greater than 50%  respiratory  variability, suggesting right atrial pressure of 3 mmHg.   CCTA 02/2024: No CAD Calcium  score 0  Independently interpreted 08/2023: Chol 164, TG 85, HDL 41, LDL 106 Hb 14 Cr 1.0 TSH 1.3   Physical Exam Vitals and nursing note reviewed.  Constitutional:      General: She is not in acute distress. Neck:     Vascular: No JVD.  Cardiovascular:     Rate and Rhythm: Normal rate and regular rhythm.     Heart sounds: Normal heart sounds. No murmur heard. Pulmonary:     Effort: Pulmonary effort is normal.     Breath sounds: Normal breath sounds. No wheezing or rales.  Musculoskeletal:     Right lower leg: No edema.     Left lower leg: No edema.      VISIT DIAGNOSES:   ICD-10-CM   1. Mixed hyperlipidemia  E78.2 EKG 12-Lead    2. Preop cardiovascular exam  Z01.810 EKG 12-Lead        [name] is a 63 y.o. female with hyperlipidemia  Assessment & Plan Preop evaluation: Reassuring workup with echocardiogram and coronary CT angiogram. Low cardiac risk for colonoscopy, okay to proceed.  Mixed  hyperlipidemia: Did not tolerate statin, currently on Zetia .  She has myalgias, okay to discontinue and try pravastatin in the future.  Defer to PCP.        F/u in 4 weeks  Signed, Newman JINNY Lawrence, MD
